# Patient Record
Sex: Female | Born: 1935
Health system: Southern US, Community
[De-identification: ages and names within clinical notes are randomized; demographics above are authoritative.]

## PROBLEM LIST (undated history)

## (undated) DIAGNOSIS — N184 Chronic kidney disease, stage 4 (severe): Secondary | ICD-10-CM

## (undated) DIAGNOSIS — E039 Hypothyroidism, unspecified: Secondary | ICD-10-CM

## (undated) DIAGNOSIS — E119 Type 2 diabetes mellitus without complications: Secondary | ICD-10-CM

## (undated) DIAGNOSIS — R609 Edema, unspecified: Secondary | ICD-10-CM

## (undated) DIAGNOSIS — J9610 Chronic respiratory failure, unspecified whether with hypoxia or hypercapnia: Secondary | ICD-10-CM

## (undated) DIAGNOSIS — J449 Chronic obstructive pulmonary disease, unspecified: Secondary | ICD-10-CM

## (undated) DIAGNOSIS — E876 Hypokalemia: Secondary | ICD-10-CM

## (undated) DIAGNOSIS — Z720 Tobacco use: Secondary | ICD-10-CM

## (undated) DIAGNOSIS — I10 Essential (primary) hypertension: Secondary | ICD-10-CM

## (undated) DIAGNOSIS — R600 Localized edema: Secondary | ICD-10-CM

## (undated) DIAGNOSIS — I480 Paroxysmal atrial fibrillation: Secondary | ICD-10-CM

## (undated) DIAGNOSIS — I5032 Chronic diastolic (congestive) heart failure: Secondary | ICD-10-CM

## (undated) HISTORY — DX: Chronic respiratory failure, unspecified whether with hypoxia or hypercapnia: J96.10

## (undated) HISTORY — DX: Tobacco use: Z72.0

## (undated) HISTORY — DX: Paroxysmal atrial fibrillation: I48.0

## (undated) HISTORY — DX: Localized edema: R60.0

## (undated) HISTORY — DX: Hypokalemia: E87.6

---

## 1998-03-22 ENCOUNTER — Other Ambulatory Visit: Admission: RE | Admit: 1998-03-22 | Discharge: 1998-03-22 | Payer: Self-pay | Admitting: Obstetrics & Gynecology

## 1999-05-15 ENCOUNTER — Other Ambulatory Visit: Admission: RE | Admit: 1999-05-15 | Discharge: 1999-05-15 | Payer: Self-pay | Admitting: Obstetrics & Gynecology

## 2000-06-09 ENCOUNTER — Other Ambulatory Visit: Admission: RE | Admit: 2000-06-09 | Discharge: 2000-06-09 | Payer: Self-pay | Admitting: Obstetrics & Gynecology

## 2006-03-16 ENCOUNTER — Inpatient Hospital Stay (HOSPITAL_COMMUNITY): Admission: EM | Admit: 2006-03-16 | Discharge: 2006-03-20 | Payer: Self-pay | Admitting: Emergency Medicine

## 2010-01-02 ENCOUNTER — Encounter
Admission: RE | Admit: 2010-01-02 | Discharge: 2010-01-24 | Payer: Self-pay | Source: Home / Self Care | Attending: Ophthalmology | Admitting: Ophthalmology

## 2010-06-22 NOTE — H&P (Signed)
NAME:  Kristy Mcdonald, Kristy Mcdonald             ACCOUNT NO.:  [id number]   MEDICAL RECORD NO.:  [id number]          PATIENT TYPE:  INP   LOCATION:  0102                         FACILITY:  [facility]   PHYSICIAN:  Della Goo, M.D. DATE OF BIRTH:  1932-08-11   DATE OF ADMISSION:  03/15/2006  DATE OF DISCHARGE:                              HISTORY & PHYSICAL   PRIMARY CARE PHYSICIAN:  Dr. Mia Creek   CHIEF COMPLAINT:  Shortness of breath.   HISTORY OF PRESENT ILLNESS:  This is a 77 year old female who was  brought to the emergency department by her family secondary to  complaints of worsening shortness of breath and wheezing.  The patient  has had chest congestion and a dry, hacky cough, fevers and chills and  body aches for approximately 1 week.  The patient is a smoker, smokes  approximately 2 packs per day for many years, reports having asthma.   PAST MEDICAL HISTORY:  1. Questionable asthma.  2. Type 2 diabetes mellitus.  3. Hypertension.  4. Hypothyroidism.   PAST SURGICAL HISTORY:  Status post ovarian cyst removal.   MEDICATIONS:  1. Amaryl 4 mg p.o. b.i.d.  2. Potassium chloride 20 mEq p.o. b.i.d.  3. Lasix 20 mg p.o. daily.  4. Armour Thyroid 60 mg 1 p.o. b.i.d.  5. Vicodin 5/500 p.r.n.  6. Ambien 10 mg 1 p.o. q.h.s. p.r.n.   ALLERGIES:  NO KNOWN DRUG ALLERGIES.   SOCIAL HISTORY:  The patient lives at home with her husband and family,  positive tobacco history, 2 packs per day.  No history of alcohol usage.  No history of drug usage.   FAMILY HISTORY:  Noncontributory   REVIEW OF SYSTEMS:  Pertinents are mentioned above.   PHYSICAL EXAMINATION FINDINGS:  GENERAL:  A 77 year old obese female in  no acute distress.   VITAL SIGNS:  Temperature 98.5, blood pressure 144/70, heart rate 78,  respirations 19-22, O2 saturations 94-96%.  HEENT:  Normocephalic, atraumatic.  Pupils equally round, reactive to  light.  Extraocular muscles are intact, funduscopic benign,  oropharynx  clear.  NECK:  Supple, full range of motion.  No thyromegaly, adenopathy, or  jugular venous distension.  CARDIOVASCULAR:  Regular rate and rhythm with occasional PVCs on the  monitor; no murmurs, gallops, or rubs.  LUNGS:  Decreased breath sounds bilaterally.  Positive rhonchi, no  rales, occasional expiratory wheeze.  ABDOMEN:  Positive bowel sounds, soft, nontender, nondistended.  EXTREMITIES:  Without edema.  NEUROLOGIC: Alert and oriented x3 without focal deficits.   LABORATORY STUDIES:  White blood cell count 6.0, hemoglobin 13.3,  hematocrit 38.5, platelets 152, D-dimer 0.49.  Sodium 140, potassium  3.5, chloride 104, CO2 29, BUN 11, creatinine 0.7, glucose 107, calcium  8.9, albumin 3.4, AST 31, ALT 28.  beta natriuretic peptide less than  30.0.  Arterial blood gas initially pH 7.420, pCO2 43.4, pO2 43.0,  bicarb 27.7, O2 saturation 77.9.  This was prior to supplemental oxygen.  The patient was placed on 15 liters of oxygen and had improvement in her  saturation.  A repeat blood gas will be performed.  Chest x-ray findings  are negative for pneumonia.  There are chronic bronchitic changes.  COPD  changes as well.  CT of the chest per the PE protocol was negative for  pulmonary embolism.  EKG revealed normal sinus rhythm without acute ST-  segment changes.   ASSESSMENT:  A 77 year old female with shortness of breath, chest  congestion, cough, wheezing. being admitted with:  1. Chronic obstructive pulmonary disease exacerbation.  2. Chronic with acute bronchitis.  3. Type 2 diabetes mellitus.  4. Hypertension.   PLAN:  The patient has been administered IV antibiotic therapy with  Avelox which will continue.  She has also been placed on nebulizer  treatments with Xopenex and Atrovent q.6 h and q.2 h p.r.n. Mucinex has  been ordered along with a high dose IV steroid taper.  Cardiac enzymes  will also be performed q.8 h, and the patient will be placed on  telemetry  for monitoring.  Nitro paste and aspirin have also been  ordered.  The patient will be placed on gastrointestinal and deep venous  thrombosis prophylaxis and continue on her regular medications.  A TSH  level and hemoglobin A1c will also be ordered and sliding scale coverage  for elevated blood sugars as needed.      Della Goo, M.D.  Electronically Signed     HJ/MEDQ  D:  03/16/2006  T:  03/16/2006  Job:  [id number]

## 2010-06-22 NOTE — Discharge Summary (Signed)
NAME:  Kristy Mcdonald             ACCOUNT NO.:  [id number]   MEDICAL RECORD NO.:  [id number]          PATIENT TYPE:  INP   LOCATION:  1414                         FACILITY:  [facility]   PHYSICIAN:  Michaelyn Barter, M.D. DATE OF BIRTH:  1932-12-23   DATE OF ADMISSION:  03/15/2006  DATE OF DISCHARGE:  03/20/2006                               DISCHARGE SUMMARY   FINAL DIAGNOSES:  1. Acute chronic obstructive pulmonary disease exacerbation.  2. Acute bronchitis.  3. Right upper lobe nodule.   SECONDARY DIAGNOSES:  1. Diabetes mellitus.  2. Hypertension.  3. Hypothyroidism.   PRIMARY CARE PHYSICIAN:  Unassigned.   PROCEDURES:  1. Chest x-ray completed March 15, 2006.  2. CT scan of the chest with angiographic contrast completed March 16, 2006.   HISTORY OF PRESENT ILLNESS:  Ms. Kristy Mcdonald is a 77 year old female who  arrived with a chief complaint of progressive shortness of breath  accompanied by wheezing.  She indicated that she had experienced chest  congestion accompanied by a hacky type of cough as well as subjective  fevers and chills and generalized body aches for approximately 1 week  prior to this admission.   PAST MEDICAL HISTORY:  Please see that dictated by Dr. Della Goo.   HOSPITAL COURSE:  1. Acute COPD exacerbation.  The patient had a chest x-ray completed      on March 15, 2006.  It revealed findings compatible with      prominent bronchitic markings.  No infiltrate.  Linear scarring      versus segmental atelectasis was found within the left lower lung      zone.  The patient was subsequently started on nebulized breathing      treatments as well as supplemental oxygen.  Over the course of her      hospitalization, her breathing did improve significantly.  She      indicated that she does have a longstanding history of cigarettes      usage.  She has been counseled to discontinue this activity.  2. Acute bronchitis.  The patient was started  on empiric IV      antibiotics with moxifloxacin 400 mg daily.  Again,l her      respiratory distress improved over the course of her      hospitalization.  3. Right upper lobe nodule.  The patient had a CT scan of her chest      completed on March 16, 2006.  It revealed no gross evidence of      pulmonary embolism with mild limitations of the exam; mild      scattered infiltrates were noted within the upper lobes with      subsegmental atelectasis within the lingula and right middle lobe.      There was a nonspecific 5 mm diameter right upper lobe nodule.  The      radiologist recommended that a repeat CT scan be completed within 6-      12 months to confirm stability.  4. Weakness/deconditioning.  This may have been associated with the  patient's respiratory-related symptoms.  5. Hypoxia.  This more than likely is secondary to a combination of      the patient's COPD as well as bronchitis.  The patient was allowed      to ambulate on the date of discharge and with ambulation, her sats      decreased to 88%.  Condition at the time of discharge, the patient      indicates currently that she feels better.  Her breathing is better      and she has no current new complaints.   VITALS ON THE DAY OF DISCHARGE:  Temperature is 97.6, heart rate 64,  respirations 20, blood pressure 129/67.  CBGs are 108.   The decision has been made to discharge the patient from the hospital.   CONDITION ON DISCHARGE:  The patient's condition at the time of  discharge is improved.   DISCHARGE MEDICATIONS:  The patient will be discharged from the hospital  on the following medications:  1. Lasix 20 mg one tablet p.o. daily.  2. K-Dur 20 mEq p.o. b.i.d.  3. Nicotine patch 14 mg daily.  4. Moxifloxacin 500 mg one tablet his daily.  5. Xopenex 0.63 mg via nebulizer q.6h.  6. Atrovent 0.5 mg via nebulizer q.6h.   The patient will have a nebulized breathing machine arranged for her.  She will also  have a visiting physical therapist, occupational therapy  and R.N.  I have called Dr. Renaye Rakers and arranged for a followup  appointment on March 26, 2006, at 3:45 p.m.  The patient has also  been told that she needs to stop smoking cigarettes.      Michaelyn Barter, M.D.  Electronically Signed     OR/MEDQ  D:  03/20/2006  T:  03/20/2006  Job:  [id number]   cc:   Renaye Rakers, M.D.  Fax: [phone]

## 2011-02-07 DIAGNOSIS — M999 Biomechanical lesion, unspecified: Secondary | ICD-10-CM | POA: Diagnosis not present

## 2011-02-07 DIAGNOSIS — M9981 Other biomechanical lesions of cervical region: Secondary | ICD-10-CM | POA: Diagnosis not present

## 2011-02-07 DIAGNOSIS — M5137 Other intervertebral disc degeneration, lumbosacral region: Secondary | ICD-10-CM | POA: Diagnosis not present

## 2011-03-28 DIAGNOSIS — M999 Biomechanical lesion, unspecified: Secondary | ICD-10-CM | POA: Diagnosis not present

## 2011-03-28 DIAGNOSIS — M5137 Other intervertebral disc degeneration, lumbosacral region: Secondary | ICD-10-CM | POA: Diagnosis not present

## 2011-03-28 DIAGNOSIS — M9981 Other biomechanical lesions of cervical region: Secondary | ICD-10-CM | POA: Diagnosis not present

## 2011-05-02 DIAGNOSIS — E119 Type 2 diabetes mellitus without complications: Secondary | ICD-10-CM | POA: Diagnosis not present

## 2011-06-11 DIAGNOSIS — H902 Conductive hearing loss, unspecified: Secondary | ICD-10-CM | POA: Diagnosis not present

## 2011-06-11 DIAGNOSIS — H6120 Impacted cerumen, unspecified ear: Secondary | ICD-10-CM | POA: Diagnosis not present

## 2011-08-07 DIAGNOSIS — J449 Chronic obstructive pulmonary disease, unspecified: Secondary | ICD-10-CM | POA: Diagnosis not present

## 2011-08-07 DIAGNOSIS — M1250 Traumatic arthropathy, unspecified site: Secondary | ICD-10-CM | POA: Diagnosis not present

## 2011-08-07 DIAGNOSIS — E119 Type 2 diabetes mellitus without complications: Secondary | ICD-10-CM | POA: Diagnosis not present

## 2011-08-07 DIAGNOSIS — I10 Essential (primary) hypertension: Secondary | ICD-10-CM | POA: Diagnosis not present

## 2011-09-19 DIAGNOSIS — M5137 Other intervertebral disc degeneration, lumbosacral region: Secondary | ICD-10-CM | POA: Diagnosis not present

## 2011-09-19 DIAGNOSIS — M999 Biomechanical lesion, unspecified: Secondary | ICD-10-CM | POA: Diagnosis not present

## 2011-09-19 DIAGNOSIS — M9981 Other biomechanical lesions of cervical region: Secondary | ICD-10-CM | POA: Diagnosis not present

## 2011-11-19 DIAGNOSIS — H35379 Puckering of macula, unspecified eye: Secondary | ICD-10-CM | POA: Diagnosis not present

## 2011-11-19 DIAGNOSIS — H35359 Cystoid macular degeneration, unspecified eye: Secondary | ICD-10-CM | POA: Diagnosis not present

## 2011-11-19 DIAGNOSIS — H3530 Unspecified macular degeneration: Secondary | ICD-10-CM | POA: Diagnosis not present

## 2011-12-05 DIAGNOSIS — I10 Essential (primary) hypertension: Secondary | ICD-10-CM | POA: Diagnosis not present

## 2011-12-05 DIAGNOSIS — E7800 Pure hypercholesterolemia, unspecified: Secondary | ICD-10-CM | POA: Diagnosis not present

## 2011-12-05 DIAGNOSIS — E119 Type 2 diabetes mellitus without complications: Secondary | ICD-10-CM | POA: Diagnosis not present

## 2011-12-05 DIAGNOSIS — G589 Mononeuropathy, unspecified: Secondary | ICD-10-CM | POA: Diagnosis not present

## 2012-01-10 DIAGNOSIS — H35379 Puckering of macula, unspecified eye: Secondary | ICD-10-CM | POA: Diagnosis not present

## 2012-01-10 DIAGNOSIS — Z01810 Encounter for preprocedural cardiovascular examination: Secondary | ICD-10-CM | POA: Diagnosis not present

## 2012-01-10 DIAGNOSIS — J449 Chronic obstructive pulmonary disease, unspecified: Secondary | ICD-10-CM | POA: Diagnosis present

## 2012-02-03 DIAGNOSIS — E119 Type 2 diabetes mellitus without complications: Secondary | ICD-10-CM | POA: Diagnosis not present

## 2012-02-03 DIAGNOSIS — H35379 Puckering of macula, unspecified eye: Secondary | ICD-10-CM | POA: Diagnosis not present

## 2012-02-03 DIAGNOSIS — I10 Essential (primary) hypertension: Secondary | ICD-10-CM | POA: Diagnosis not present

## 2012-02-03 DIAGNOSIS — E079 Disorder of thyroid, unspecified: Secondary | ICD-10-CM | POA: Diagnosis not present

## 2012-02-03 DIAGNOSIS — H35359 Cystoid macular degeneration, unspecified eye: Secondary | ICD-10-CM | POA: Diagnosis not present

## 2012-02-03 DIAGNOSIS — F17200 Nicotine dependence, unspecified, uncomplicated: Secondary | ICD-10-CM | POA: Diagnosis not present

## 2012-02-03 DIAGNOSIS — Z888 Allergy status to other drugs, medicaments and biological substances status: Secondary | ICD-10-CM | POA: Diagnosis not present

## 2012-02-03 DIAGNOSIS — M7989 Other specified soft tissue disorders: Secondary | ICD-10-CM | POA: Diagnosis not present

## 2012-02-03 DIAGNOSIS — J4489 Other specified chronic obstructive pulmonary disease: Secondary | ICD-10-CM | POA: Diagnosis not present

## 2012-02-03 DIAGNOSIS — Z8249 Family history of ischemic heart disease and other diseases of the circulatory system: Secondary | ICD-10-CM | POA: Diagnosis not present

## 2012-02-03 DIAGNOSIS — H353290 Exudative age-related macular degeneration, unspecified eye, stage unspecified: Secondary | ICD-10-CM | POA: Diagnosis not present

## 2012-02-03 DIAGNOSIS — J449 Chronic obstructive pulmonary disease, unspecified: Secondary | ICD-10-CM | POA: Diagnosis not present

## 2012-02-03 DIAGNOSIS — Z961 Presence of intraocular lens: Secondary | ICD-10-CM | POA: Diagnosis not present

## 2012-02-03 DIAGNOSIS — Z9849 Cataract extraction status, unspecified eye: Secondary | ICD-10-CM | POA: Diagnosis not present

## 2012-04-02 DIAGNOSIS — E119 Type 2 diabetes mellitus without complications: Secondary | ICD-10-CM | POA: Diagnosis not present

## 2012-04-02 DIAGNOSIS — E7800 Pure hypercholesterolemia, unspecified: Secondary | ICD-10-CM | POA: Diagnosis not present

## 2012-04-02 DIAGNOSIS — I10 Essential (primary) hypertension: Secondary | ICD-10-CM | POA: Diagnosis not present

## 2012-05-14 DIAGNOSIS — E119 Type 2 diabetes mellitus without complications: Secondary | ICD-10-CM | POA: Diagnosis not present

## 2012-05-14 DIAGNOSIS — M109 Gout, unspecified: Secondary | ICD-10-CM | POA: Diagnosis not present

## 2012-05-15 ENCOUNTER — Other Ambulatory Visit: Payer: Self-pay | Admitting: Family Medicine

## 2012-05-15 ENCOUNTER — Ambulatory Visit
Admission: RE | Admit: 2012-05-15 | Discharge: 2012-05-15 | Disposition: A | Discharge status: Home / Self Care | Payer: Medicare Other | Source: Ambulatory Visit | Attending: Family Medicine | Admitting: Family Medicine

## 2012-05-15 DIAGNOSIS — M79671 Pain in right foot: Secondary | ICD-10-CM

## 2012-05-15 DIAGNOSIS — M79609 Pain in unspecified limb: Secondary | ICD-10-CM | POA: Diagnosis not present

## 2012-06-30 DIAGNOSIS — E119 Type 2 diabetes mellitus without complications: Secondary | ICD-10-CM | POA: Diagnosis not present

## 2012-06-30 DIAGNOSIS — H35379 Puckering of macula, unspecified eye: Secondary | ICD-10-CM | POA: Diagnosis not present

## 2012-06-30 DIAGNOSIS — H3530 Unspecified macular degeneration: Secondary | ICD-10-CM | POA: Diagnosis not present

## 2012-06-30 DIAGNOSIS — H35359 Cystoid macular degeneration, unspecified eye: Secondary | ICD-10-CM | POA: Diagnosis not present

## 2012-06-30 DIAGNOSIS — J449 Chronic obstructive pulmonary disease, unspecified: Secondary | ICD-10-CM | POA: Diagnosis not present

## 2012-06-30 DIAGNOSIS — R609 Edema, unspecified: Secondary | ICD-10-CM | POA: Diagnosis not present

## 2012-07-30 DIAGNOSIS — I10 Essential (primary) hypertension: Secondary | ICD-10-CM | POA: Diagnosis not present

## 2012-07-30 DIAGNOSIS — E119 Type 2 diabetes mellitus without complications: Secondary | ICD-10-CM | POA: Diagnosis not present

## 2012-07-30 DIAGNOSIS — E7800 Pure hypercholesterolemia, unspecified: Secondary | ICD-10-CM | POA: Diagnosis not present

## 2012-08-13 DIAGNOSIS — E119 Type 2 diabetes mellitus without complications: Secondary | ICD-10-CM | POA: Diagnosis not present

## 2012-12-03 DIAGNOSIS — E7800 Pure hypercholesterolemia, unspecified: Secondary | ICD-10-CM | POA: Diagnosis not present

## 2012-12-03 DIAGNOSIS — I10 Essential (primary) hypertension: Secondary | ICD-10-CM | POA: Diagnosis not present

## 2012-12-03 DIAGNOSIS — E119 Type 2 diabetes mellitus without complications: Secondary | ICD-10-CM | POA: Diagnosis not present

## 2013-01-26 DIAGNOSIS — J449 Chronic obstructive pulmonary disease, unspecified: Secondary | ICD-10-CM | POA: Diagnosis not present

## 2013-01-26 DIAGNOSIS — E119 Type 2 diabetes mellitus without complications: Secondary | ICD-10-CM | POA: Diagnosis not present

## 2013-01-26 DIAGNOSIS — H35379 Puckering of macula, unspecified eye: Secondary | ICD-10-CM | POA: Diagnosis not present

## 2013-01-26 DIAGNOSIS — R609 Edema, unspecified: Secondary | ICD-10-CM | POA: Diagnosis not present

## 2013-01-26 DIAGNOSIS — H35359 Cystoid macular degeneration, unspecified eye: Secondary | ICD-10-CM | POA: Diagnosis not present

## 2013-01-26 DIAGNOSIS — H353290 Exudative age-related macular degeneration, unspecified eye, stage unspecified: Secondary | ICD-10-CM | POA: Diagnosis not present

## 2013-04-20 DIAGNOSIS — H353290 Exudative age-related macular degeneration, unspecified eye, stage unspecified: Secondary | ICD-10-CM | POA: Diagnosis not present

## 2013-04-20 DIAGNOSIS — H35359 Cystoid macular degeneration, unspecified eye: Secondary | ICD-10-CM | POA: Diagnosis not present

## 2013-04-27 DIAGNOSIS — H353290 Exudative age-related macular degeneration, unspecified eye, stage unspecified: Secondary | ICD-10-CM | POA: Diagnosis not present

## 2013-05-20 ENCOUNTER — Other Ambulatory Visit (HOSPITAL_COMMUNITY)
Admission: RE | Admit: 2013-05-20 | Discharge: 2013-05-20 | Disposition: A | Discharge status: Home / Self Care | Payer: Medicare Other | Source: Ambulatory Visit | Attending: Family Medicine | Admitting: Family Medicine

## 2013-05-20 DIAGNOSIS — N390 Urinary tract infection, site not specified: Secondary | ICD-10-CM | POA: Diagnosis not present

## 2013-05-20 DIAGNOSIS — Z113 Encounter for screening for infections with a predominantly sexual mode of transmission: Secondary | ICD-10-CM | POA: Diagnosis not present

## 2013-05-20 DIAGNOSIS — N760 Acute vaginitis: Secondary | ICD-10-CM | POA: Insufficient documentation

## 2013-05-20 DIAGNOSIS — E119 Type 2 diabetes mellitus without complications: Secondary | ICD-10-CM | POA: Diagnosis not present

## 2013-06-03 DIAGNOSIS — E119 Type 2 diabetes mellitus without complications: Secondary | ICD-10-CM | POA: Diagnosis not present

## 2013-06-29 DIAGNOSIS — E1139 Type 2 diabetes mellitus with other diabetic ophthalmic complication: Secondary | ICD-10-CM | POA: Diagnosis not present

## 2013-06-29 DIAGNOSIS — H35359 Cystoid macular degeneration, unspecified eye: Secondary | ICD-10-CM | POA: Diagnosis not present

## 2013-06-29 DIAGNOSIS — J449 Chronic obstructive pulmonary disease, unspecified: Secondary | ICD-10-CM | POA: Diagnosis not present

## 2013-06-29 DIAGNOSIS — H3530 Unspecified macular degeneration: Secondary | ICD-10-CM | POA: Diagnosis not present

## 2013-06-29 DIAGNOSIS — E11329 Type 2 diabetes mellitus with mild nonproliferative diabetic retinopathy without macular edema: Secondary | ICD-10-CM | POA: Diagnosis not present

## 2013-06-29 DIAGNOSIS — H35379 Puckering of macula, unspecified eye: Secondary | ICD-10-CM | POA: Diagnosis not present

## 2013-08-05 DIAGNOSIS — E119 Type 2 diabetes mellitus without complications: Secondary | ICD-10-CM | POA: Diagnosis not present

## 2013-10-27 DIAGNOSIS — E119 Type 2 diabetes mellitus without complications: Secondary | ICD-10-CM | POA: Diagnosis not present

## 2013-10-27 DIAGNOSIS — J069 Acute upper respiratory infection, unspecified: Secondary | ICD-10-CM | POA: Diagnosis not present

## 2013-10-27 DIAGNOSIS — N760 Acute vaginitis: Secondary | ICD-10-CM | POA: Diagnosis not present

## 2013-10-27 DIAGNOSIS — I10 Essential (primary) hypertension: Secondary | ICD-10-CM | POA: Diagnosis not present

## 2013-11-02 DIAGNOSIS — E11329 Type 2 diabetes mellitus with mild nonproliferative diabetic retinopathy without macular edema: Secondary | ICD-10-CM | POA: Diagnosis not present

## 2013-11-02 DIAGNOSIS — E1139 Type 2 diabetes mellitus with other diabetic ophthalmic complication: Secondary | ICD-10-CM | POA: Diagnosis not present

## 2013-11-02 DIAGNOSIS — H35359 Cystoid macular degeneration, unspecified eye: Secondary | ICD-10-CM | POA: Diagnosis not present

## 2013-11-02 DIAGNOSIS — H35379 Puckering of macula, unspecified eye: Secondary | ICD-10-CM | POA: Diagnosis not present

## 2013-11-02 DIAGNOSIS — H3530 Unspecified macular degeneration: Secondary | ICD-10-CM | POA: Diagnosis not present

## 2013-12-15 ENCOUNTER — Encounter (HOSPITAL_COMMUNITY): Payer: Self-pay | Admitting: *Deleted

## 2013-12-15 ENCOUNTER — Emergency Department (HOSPITAL_COMMUNITY): Payer: Medicare Other

## 2013-12-15 ENCOUNTER — Emergency Department (HOSPITAL_COMMUNITY)
Admission: EM | Admit: 2013-12-15 | Discharge: 2013-12-16 | Disposition: A | Discharge status: Home / Self Care | Payer: Medicare Other | Attending: Emergency Medicine | Admitting: Emergency Medicine

## 2013-12-15 DIAGNOSIS — H538 Other visual disturbances: Secondary | ICD-10-CM | POA: Diagnosis not present

## 2013-12-15 DIAGNOSIS — R51 Headache: Secondary | ICD-10-CM | POA: Insufficient documentation

## 2013-12-15 DIAGNOSIS — Z961 Presence of intraocular lens: Secondary | ICD-10-CM | POA: Diagnosis not present

## 2013-12-15 DIAGNOSIS — Z79899 Other long term (current) drug therapy: Secondary | ICD-10-CM | POA: Diagnosis not present

## 2013-12-15 DIAGNOSIS — H539 Unspecified visual disturbance: Secondary | ICD-10-CM

## 2013-12-15 DIAGNOSIS — H47011 Ischemic optic neuropathy, right eye: Secondary | ICD-10-CM | POA: Diagnosis not present

## 2013-12-15 DIAGNOSIS — E079 Disorder of thyroid, unspecified: Secondary | ICD-10-CM | POA: Diagnosis not present

## 2013-12-15 DIAGNOSIS — I10 Essential (primary) hypertension: Secondary | ICD-10-CM | POA: Diagnosis not present

## 2013-12-15 DIAGNOSIS — H5704 Mydriasis: Secondary | ICD-10-CM | POA: Insufficient documentation

## 2013-12-15 DIAGNOSIS — H3531 Nonexudative age-related macular degeneration: Secondary | ICD-10-CM | POA: Diagnosis not present

## 2013-12-15 DIAGNOSIS — H5711 Ocular pain, right eye: Secondary | ICD-10-CM | POA: Diagnosis present

## 2013-12-15 DIAGNOSIS — E119 Type 2 diabetes mellitus without complications: Secondary | ICD-10-CM | POA: Diagnosis not present

## 2013-12-15 DIAGNOSIS — M315 Giant cell arteritis with polymyalgia rheumatica: Secondary | ICD-10-CM | POA: Diagnosis not present

## 2013-12-15 HISTORY — DX: Essential (primary) hypertension: I10

## 2013-12-15 LAB — CBC WITH DIFFERENTIAL/PLATELET
BASOS ABS: 0 10*3/uL (ref 0.0–0.1)
BASOS PCT: 0 % (ref 0–1)
EOS ABS: 0.1 10*3/uL (ref 0.0–0.7)
EOS PCT: 1 % (ref 0–5)
HCT: 39.9 % (ref 36.0–46.0)
HEMOGLOBIN: 13.7 g/dL (ref 12.0–15.0)
LYMPHS PCT: 34 % (ref 12–46)
Lymphs Abs: 3.4 10*3/uL (ref 0.7–4.0)
MCH: 30.8 pg (ref 26.0–34.0)
MCHC: 34.3 g/dL (ref 30.0–36.0)
MCV: 89.7 fL (ref 78.0–100.0)
MONO ABS: 0.6 10*3/uL (ref 0.1–1.0)
MONOS PCT: 6 % (ref 3–12)
NEUTROS ABS: 5.8 10*3/uL (ref 1.7–7.7)
Neutrophils Relative %: 59 % (ref 43–77)
PLATELETS: 238 10*3/uL (ref 150–400)
RBC: 4.45 MIL/uL (ref 3.87–5.11)
RDW: 13.4 % (ref 11.5–15.5)
WBC: 9.9 10*3/uL (ref 4.0–10.5)

## 2013-12-15 LAB — SEDIMENTATION RATE: SED RATE: 8 mm/h (ref 0–22)

## 2013-12-15 MED ORDER — DIAZEPAM 5 MG PO TABS
10.0000 mg | ORAL_TABLET | Freq: Once | ORAL | Status: AC
Start: 2013-12-15 — End: 2013-12-15
  Administered 2013-12-15: 10 mg via ORAL
  Filled 2013-12-15: qty 2

## 2013-12-15 MED ORDER — LORAZEPAM 2 MG/ML IJ SOLN
1.0000 mg | Freq: Once | INTRAMUSCULAR | Status: DC
Start: 2013-12-15 — End: 2013-12-16
  Filled 2013-12-15: qty 1

## 2013-12-15 NOTE — ED Notes (Signed)
MD at bedside. 

## 2013-12-15 NOTE — ED Provider Notes (Signed)
81 year old female was sent to the ED by her ophthalmologist. She has been having pain behind her right high in the eye was dilated and he noted signs of possible decreased blood flow to the optic nerve and was concerned about possible vascular disease. Patient states that she has bad vision in that eye and it is not changed recently and that headaches have been going on for several weeks now. On exam, the right eye is still dilated from ophthalmologists exam and there is pallor of the optic disc about with sharp margins. There are no hemorrhages or exudates. Lungs are clear and heart has regular rate and rhythm. MRI was ordered to evaluate the optic nerve and brain but patient states that she cannot tolerate MRI scan and she is refusing IV and IV sedation. Neurology consult will be obtained but anticipate sending her back to her ophthalmologist for follow-up in consideration for obtaining MRI scans through an open scanner.  I saw and evaluated the patient, reviewed the resident's note and I agree with the findings and plan.    Delora Fuel, MD 29/19/16 [id number]

## 2013-12-15 NOTE — ED Provider Notes (Signed)
CSN: [id number]     Arrival date & time 12/15/13  1856 History   First MD Initiated Contact with Patient 12/15/13 1927     Chief Complaint  Patient presents with  . Headache  . Eye Pain     (Consider location/radiation/quality/duration/timing/severity/associated sxs/prior Treatment) HPI Comments: 80 y/o female with h/o macular degeneration, multiple prior eye surgeries presenting for evaluation of HA and worsening blurry vision. Symptoms present x 10 days. Notes right temporal HA. Baseline vision is blurry, unable to read without magnifying glass. Seen by her Ophthalmologist who was concerned for ischemic optic neuritis. She received dilated exam and IOP were 15 bilaterally. She denies weakness, numbness, gait abnormality, dizziness. No h/o head injury or anticoagulation.  The history is provided by the patient. No language interpreter was used.    Past Medical History  Diagnosis Date  . Thyroid disease   . Hypertension   . Diabetes mellitus without complication    History reviewed. No pertinent past surgical history. History reviewed. No pertinent family history. History  Substance Use Topics  . Smoking status: Not on file  . Smokeless tobacco: Not on file  . Alcohol Use: No   OB History    No data available     Review of Systems  Constitutional: Negative for fever.  HENT: Negative for congestion, hearing loss, sinus pressure and tinnitus.   Eyes: Positive for pain and visual disturbance. Negative for discharge and redness.  Respiratory: Negative for chest tightness and shortness of breath.   Cardiovascular: Negative for chest pain.  Gastrointestinal: Negative for nausea and vomiting.  Musculoskeletal: Negative for neck pain and neck stiffness.  Neurological: Positive for headaches. Negative for dizziness, facial asymmetry, speech difficulty, weakness and numbness.  Hematological: Does not bruise/bleed easily.  All other systems reviewed and are  negative.     Allergies  Nicotine  Home Medications   Prior to Admission medications   Medication Sig Start Date End Date Taking? Authorizing Provider  albuterol (PROVENTIL HFA;VENTOLIN HFA) 108 (90 BASE) MCG/ACT inhaler Inhale 2 puffs into the lungs every 6 (six) hours as needed for wheezing or shortness of breath.   Yes Historical Provider, MD  amitriptyline (ELAVIL) 10 MG tablet Take 10 mg by mouth at bedtime.   Yes Historical Provider, MD  diazepam (VALIUM) 5 MG tablet Take 2.5 mg by mouth at bedtime. Take every night per patient   Yes Historical Provider, MD  furosemide (LASIX) 20 MG tablet Take 20 mg by mouth daily.   Yes Historical Provider, MD  glimepiride (AMARYL) 4 MG tablet Take 4 mg by mouth daily with breakfast.   Yes Historical Provider, MD  levothyroxine (SYNTHROID, LEVOTHROID) 50 MCG tablet Take 50 mcg by mouth daily before breakfast.   Yes Historical Provider, MD  metFORMIN (GLUMETZA) 1000 MG (MOD) 24 hr tablet Take 1,000 mg by mouth daily with breakfast.   Yes Historical Provider, MD  metolazone (ZAROXOLYN) 2.5 MG tablet Take 2.5 mg by mouth daily.   Yes Historical Provider, MD  Polyethyl Glycol-Propyl Glycol (SYSTANE FREE OP) Place 1 drop into both eyes daily as needed. For dry eyes   Yes Historical Provider, MD  potassium chloride SA (K-DUR,KLOR-CON) 20 MEQ tablet Take 20 mEq by mouth daily.   Yes Historical Provider, MD  tiotropium (SPIRIVA HANDIHALER) 18 MCG inhalation capsule Place 18 mcg into inhaler and inhale daily.   Yes Historical Provider, MD   BP 123/62 mmHg  Pulse 70  Temp(Src) 97.7 F (36.5 C)  Resp 21  Ht  5' 5"  (1.651 m)  Wt 207 lb (93.895 kg)  BMI 34.45 kg/m2  SpO2 95% Physical Exam  Constitutional: She is oriented to person, place, and time. She appears well-developed and well-nourished. No distress.  HENT:  Head: Normocephalic and atraumatic.  Mouth/Throat: Oropharynx is clear and moist.  Eyes: Conjunctivae, EOM and lids are normal.   Fundoscopic exam:      The right eye shows no exudate, no hemorrhage and no papilledema.       The left eye shows no exudate, no hemorrhage and no papilledema.  Pupils dilated  Neck: Normal range of motion. Neck supple.  Cardiovascular: Normal rate and regular rhythm.   Pulmonary/Chest: Effort normal and breath sounds normal.  Abdominal: Soft. There is no tenderness.  Musculoskeletal: Normal range of motion.  Neurological: She is alert and oriented to person, place, and time. She has normal strength and normal reflexes. No cranial nerve deficit or sensory deficit. She displays a negative Romberg sign. Coordination and gait normal. GCS eye subscore is 4. GCS verbal subscore is 5. GCS motor subscore is 6.  Skin: Skin is warm.  Vitals reviewed.   ED Course  Procedures (including critical care time) Labs Review Labs Reviewed  SEDIMENTATION RATE  CBC WITH DIFFERENTIAL    Imaging Review Mr Brain Wo Contrast  12/15/2013   CLINICAL DATA:  Initial valuation for headache, vision problems.  EXAM: MRI HEAD WITHOUT CONTRAST  TECHNIQUE: Multiplanar, multiecho pulse sequences of the brain and surrounding structures were obtained without intravenous contrast.  COMPARISON:  None available.  FINDINGS: Study is limited as the patient was only able to tolerate a portion of the exam. Sagittal T1 weighted sequence with diffusion-weighted sequences are provided for interpretation.  No abnormal foci of restricted diffusion to suggest acute intracranial hand marked identified. Gray-white matter differentiation grossly maintained.  No mass lesion or midline shift. Ventricles are normal in size without evidence of hydrocephalus. No definite extra-axial fluid collection. No definite hemorrhage.  Craniocervical junction within normal limits. Pituitary gland with unremarkable. Visualized bone marrow signal intensity is unremarkable  Paranasal sinuses are grossly clear.  IMPRESSION: Limited study demonstrating no acute  intracranial infarct or other abnormality.   Electronically Signed   By: Jeannine Boga M.D.   On: 12/15/2013 23:19     EKG Interpretation None      MDM   Final diagnoses:  Vision abnormalities    80 y/o female with h/o macular degeneration with HA and blurry vision x 10 days. Now reports blurry vision at baseline. Ophthalmologist concerned for ischemic optic neuritis. IOP wnl. Eyes dilated from exam. No hemorrhage or exudates. Discs sharp. Nonfocal neuro exam, low suspicion for central etiology. Visual acuity not tested due to lack of corrective lenses and dilated pupils. ESR not elevated, doubt temporal arteritis. Pt sent for MRI brain/orbits after given valium but unable to tolerate due to anxiety. She refuses IV and sedation. Will consult Neurology  Who is in agreement with plan for outpatient open MRI. Will f/u with her ophthalmologist this week.     Amparo Bristol, MD 12/17/13 [phone]

## 2013-12-15 NOTE — ED Notes (Signed)
Pt desating while at rest down to 85% RA; O2 placed at 2 L/M

## 2013-12-15 NOTE — ED Notes (Signed)
Pt reports having right eye pain, blurred vision and headache x 10 days. Went to eye dr today and told to come here due to possible ischemic optic neuropathy.

## 2013-12-15 NOTE — Consult Note (Signed)
NEURO HOSPITALIST CONSULT NOTE    Reason for Consult: right eye pain and visual disturbances.  HPI:                                                                                                                                          Kristy Mcdonald is an 80 y.o. female with a past medical history significant for HTN, DM type II, thyroid disease, macular degeneration and COPD, sent to the ED by her ophthalmologist for possible AION. Patient indicated that she has been having pain in the right eye for the past 10 days that got worse today. She said that she has poor vision in her right eye due to macular degeneration and overall her vision stays the same. She describes a daily, intermittent, severe, disabling pain around and behind the right eye that wakes her up around 6 am daily, last for approximately 1 hour, subsides after taking tylenol, and then comes back again. Touching the right upper corner of the right eye causes some discomfort but no tenderness right temporal area. Doesn't pace the floor. Stated that the right eye gets " puffy and watery" but denies nasal congestion, sensitivity to light or noise, vertigo, double vision, focal weakness/numbness, slurred speech, or language impairment. No jaw claudication, difficulty chewing or swallowing, or fever.   She cannot tolerate MRI scan and she is refusing IV and IV sedation. Sed rate normal.   Past Medical History  Diagnosis Date  . Thyroid disease   . Hypertension   . Diabetes mellitus without complication     History reviewed. No pertinent past surgical history.  History reviewed. No pertinent family history.   Social History:  reports that she does not drink alcohol or use illicit drugs. Her tobacco history is not on file.  Allergies  Allergen Reactions  . Nicotine     Unknown    MEDICATIONS:                                                                                                                      I have reviewed the patient's current medications.   ROS:  History obtained from the patient and chart review  General ROS: negative for - chills, fatigue, fever, night sweats, or weight loss Psychological ROS: negative for - behavioral disorder, hallucinations, memory difficulties, mood swings or suicidal ideation Ophthalmic ROS: negative for - double vision ENT ROS: negative for - epistaxis, nasal discharge, oral lesions, sore throat, tinnitus or vertigo Allergy and Immunology ROS: negative for - hives or itchy/watery eyes Hematological and Lymphatic ROS: negative for - bleeding problems, bruising or swollen lymph nodes Endocrine ROS: negative for - galactorrhea, hair pattern changes, polydipsia/polyuria or temperature intolerance Respiratory ROS: negative for - cough, hemoptysis, shortness of breath or wheezing Cardiovascular ROS: negative for - chest pain, dyspnea on exertion, edema or irregular heartbeat Gastrointestinal ROS: negative for - abdominal pain, diarrhea, hematemesis, nausea/vomiting or stool incontinence Genito-Urinary ROS: negative for - dysuria, hematuria, incontinence or urinary frequency/urgency Musculoskeletal ROS: negative for - joint swelling or muscular weakness Neurological ROS: as noted in HPI Dermatological ROS: negative for rash and skin lesion changes  Physical exam: pleasant female in no apparent distress. Blood pressure 123/62, pulse 70, temperature 97.7 F (36.5 C), resp. rate 21, height 5\' 5"  (1.651 m), weight 93.895 kg (207 lb), SpO2 95 %. Head: normocephalic. Neck: supple, no bruits, no JVD. Cardiac: no murmurs. Lungs: clear. Abdomen: soft, no tender, no mass. Extremities: no edema.  Neurologic Examination:                                                                                                        General: Mental Status: Alert, oriented, thought content appropriate.  Speech fluent without evidence of aphasia.  Able to follow 3 step commands without difficulty. Cranial Nerves: II: Discs flat bilaterally without hemorrhages or exudates.Visual fields grossly normal, pupils are still dilated after recent examination by ophthalmologist and are  equal, round, reactive to light and accommodation III,IV, VI: ptosis not present, extra-ocular motions intact bilaterally V,VII: smile symmetric, facial light touch sensation normal bilaterally VIII: hearing normal bilaterally IX,X: gag reflex present XI: bilateral shoulder shrug XII: midline tongue extension without atrophy or fasciculations  Motor: Right : Upper extremity   5/5    Left:     Upper extremity   5/5  Lower extremity   5/5     Lower extremity   5/5 Tone and bulk:normal tone throughout; no atrophy noted Sensory: Pinprick and light touch intact throughout, bilaterally Deep Tendon Reflexes:  Right: Upper Extremity   Left: Upper extremity   biceps (C-5 to C-6) 2/4   biceps (C-5 to C-6) 2/4 tricep (C7) 2/4    triceps (C7) 2/4 Brachioradialis (C6) 2/4  Brachioradialis (C6) 2/4  Lower Extremity Lower Extremity  quadriceps (L-2 to L-4) 2/4   quadriceps (L-2 to L-4) 2/4 Achilles (S1) 2/4   Achilles (S1) 2/4  Plantars: Right: downgoing   Left: downgoing Cerebellar: normal finger-to-nose,  normal heel-to-shin test Gait:  No tested.  No results found for: CHOL  Results for orders placed or performed during the hospital encounter of 12/15/13 (from the past 48 hour(s))  Sedimentation rate  Status: None   Collection Time: 12/15/13  8:01 PM  Result Value Ref Range   Sed Rate 8 0 - 22 mm/hr  CBC with Differential     Status: None   Collection Time: 12/15/13  8:01 PM  Result Value Ref Range   WBC 9.9 4.0 - 10.5 K/uL    Comment: WHITE COUNT CONFIRMED ON SMEAR   RBC 4.45 3.87 - 5.11 MIL/uL   Hemoglobin 13.7 12.0 - 15.0 g/dL    HCT 39.9 36.0 - 46.0 %   MCV 89.7 78.0 - 100.0 fL   MCH 30.8 26.0 - 34.0 pg   MCHC 34.3 30.0 - 36.0 g/dL   RDW 13.4 11.5 - 15.5 %   Platelets 238 150 - 400 K/uL    Comment: PLATELET COUNT CONFIRMED BY SMEAR   Neutrophils Relative % 59 43 - 77 %   Lymphocytes Relative 34 12 - 46 %   Monocytes Relative 6 3 - 12 %   Eosinophils Relative 1 0 - 5 %   Basophils Relative 0 0 - 1 %   Neutro Abs 5.8 1.7 - 7.7 K/uL   Lymphs Abs 3.4 0.7 - 4.0 K/uL   Monocytes Absolute 0.6 0.1 - 1.0 K/uL   Eosinophils Absolute 0.1 0.0 - 0.7 K/uL   Basophils Absolute 0.0 0.0 - 0.1 K/uL   WBC Morphology ATYPICAL LYMPHOCYTES     Mr Brain Wo Contrast  12/15/2013   CLINICAL DATA:  Initial valuation for headache, vision problems.  EXAM: MRI HEAD WITHOUT CONTRAST  TECHNIQUE: Multiplanar, multiecho pulse sequences of the brain and surrounding structures were obtained without intravenous contrast.  COMPARISON:  None available.  FINDINGS: Study is limited as the patient was only able to tolerate a portion of the exam. Sagittal T1 weighted sequence with diffusion-weighted sequences are provided for interpretation.  No abnormal foci of restricted diffusion to suggest acute intracranial hand marked identified. Gray-white matter differentiation grossly maintained.  No mass lesion or midline shift. Ventricles are normal in size without evidence of hydrocephalus. No definite extra-axial fluid collection. No definite hemorrhage.  Craniocervical junction within normal limits. Pituitary gland with unremarkable. Visualized bone marrow signal intensity is unremarkable  Paranasal sinuses are grossly clear.  IMPRESSION: Limited study demonstrating no acute intracranial infarct or other abnormality.   Electronically Signed   By: Jeannine Boga M.D.   On: 12/15/2013 23:19   Assessment/Plan: 80 y/o with DM, HTN, sent to the ED by her ophthalmologist due to HA and blurred vision for the past 10 days, and concern for AION.  Sed rate  negative and the overall picture is not reminiscent of temporal arteritis. Can not entirely exclude a short lasting HA.  Patient has been symptomatic for at least 10 days and she adamantly denies a change in her visual acuity saying that " I have macular degeneration and my vision stays the same". Patient unable to tolerate MRI and is refusing IV sedation to attempt MRI. No availability of open MRI in the hospital and thus this will need to be arrange as outpatient.   Dorian Pod, MD 12/15/2013, 11:56 PM

## 2013-12-15 NOTE — ED Notes (Signed)
Attempted to start IV and give med; pt states she will not go back to MRI; Md notified;Md assess pt. Again

## 2013-12-15 NOTE — ED Notes (Signed)
Patient transported to MRI 

## 2013-12-16 DIAGNOSIS — H538 Other visual disturbances: Secondary | ICD-10-CM | POA: Diagnosis not present

## 2013-12-16 DIAGNOSIS — H539 Unspecified visual disturbance: Secondary | ICD-10-CM

## 2013-12-16 MED ORDER — OXYCODONE-ACETAMINOPHEN 5-325 MG PO TABS
1.0000 | ORAL_TABLET | Freq: Once | ORAL | Status: AC
  Administered 2013-12-16: 1 via ORAL
  Filled 2013-12-16: qty 1

## 2013-12-16 NOTE — Discharge Instructions (Signed)
Visual Disturbances °You have had a disturbance in your vision. This may be caused by various conditions, such as: °· Migraines. Migraine headaches are often preceded by a disturbance in vision. Blind spots or light flashes are followed by a headache. This type of visual disturbance is temporary. It does not damage the eye. °· Glaucoma. This is caused by increased pressure in the eye. Symptoms include haziness, blurred vision, or seeing rainbow colored circles when looking at bright lights. Partial or complete visual loss can occur. You may or may not experience eye pain. Visual loss may be gradual or sudden and is irreversible. Glaucoma is the leading cause of blindness. °· Retina problems. Vision will be reduced if the retina becomes detached or if there is a circulation problem as with diabetes, high blood pressure, or a mini-stroke. Symptoms include seeing "floaters," flashes of light, or shadows, as if a curtain has fallen over your eye. °· Optic nerve problems. The main nerve in your eye can be damaged by redness, soreness, and swelling (inflammation), poor circulation, drugs, and toxins. °It is very important to have a complete exam done by a specialist to determine the exact cause of your eye problem. The specialist may recommend medicines or surgery, depending on the cause of the problem. This can help prevent further loss of vision or reduce the risk of having a stroke. Contact the caregiver to whom you have been referred and arrange for follow-up care right away. °SEEK IMMEDIATE MEDICAL CARE IF:  °· Your vision gets worse. °· You develop severe headaches. °· You have any weakness or numbness in the face, arms, or legs. °· You have any trouble speaking or walking. °Document Released: 02/29/2004 Document Revised: 04/15/2011 Document Reviewed: 06/21/2009 °[employer]® Patient Information ©2015 [employer]. This information is not intended to replace advice given to you by your health care provider. Make sure  you discuss any questions you have with your health care provider. ° °

## 2013-12-22 DIAGNOSIS — Z961 Presence of intraocular lens: Secondary | ICD-10-CM | POA: Diagnosis not present

## 2013-12-22 DIAGNOSIS — H47011 Ischemic optic neuropathy, right eye: Secondary | ICD-10-CM | POA: Diagnosis not present

## 2013-12-22 DIAGNOSIS — E119 Type 2 diabetes mellitus without complications: Secondary | ICD-10-CM | POA: Diagnosis not present

## 2013-12-22 DIAGNOSIS — H3531 Nonexudative age-related macular degeneration: Secondary | ICD-10-CM | POA: Diagnosis not present

## 2013-12-28 DIAGNOSIS — H468 Other optic neuritis: Secondary | ICD-10-CM | POA: Diagnosis not present

## 2014-01-03 DIAGNOSIS — H468 Other optic neuritis: Secondary | ICD-10-CM | POA: Diagnosis not present

## 2014-01-03 DIAGNOSIS — E119 Type 2 diabetes mellitus without complications: Secondary | ICD-10-CM | POA: Diagnosis not present

## 2014-01-03 DIAGNOSIS — H3531 Nonexudative age-related macular degeneration: Secondary | ICD-10-CM | POA: Diagnosis not present

## 2014-01-03 DIAGNOSIS — Z961 Presence of intraocular lens: Secondary | ICD-10-CM | POA: Diagnosis not present

## 2014-01-04 DIAGNOSIS — H3530 Unspecified macular degeneration: Secondary | ICD-10-CM | POA: Diagnosis not present

## 2014-01-04 DIAGNOSIS — E11329 Type 2 diabetes mellitus with mild nonproliferative diabetic retinopathy without macular edema: Secondary | ICD-10-CM | POA: Diagnosis not present

## 2014-01-04 DIAGNOSIS — H35373 Puckering of macula, bilateral: Secondary | ICD-10-CM | POA: Diagnosis not present

## 2014-03-01 DIAGNOSIS — E119 Type 2 diabetes mellitus without complications: Secondary | ICD-10-CM | POA: Diagnosis not present

## 2014-03-01 DIAGNOSIS — J0100 Acute maxillary sinusitis, unspecified: Secondary | ICD-10-CM | POA: Diagnosis not present

## 2014-03-01 DIAGNOSIS — R634 Abnormal weight loss: Secondary | ICD-10-CM | POA: Diagnosis not present

## 2014-03-17 DIAGNOSIS — E119 Type 2 diabetes mellitus without complications: Secondary | ICD-10-CM | POA: Diagnosis not present

## 2014-03-17 DIAGNOSIS — O871 Deep phlebothrombosis in the puerperium: Secondary | ICD-10-CM | POA: Diagnosis not present

## 2014-03-17 DIAGNOSIS — L03116 Cellulitis of left lower limb: Secondary | ICD-10-CM | POA: Diagnosis not present

## 2014-03-18 ENCOUNTER — Other Ambulatory Visit: Payer: Self-pay | Admitting: Family Medicine

## 2014-03-18 DIAGNOSIS — I82403 Acute embolism and thrombosis of unspecified deep veins of lower extremity, bilateral: Secondary | ICD-10-CM

## 2014-03-18 DIAGNOSIS — M7989 Other specified soft tissue disorders: Secondary | ICD-10-CM | POA: Diagnosis not present

## 2014-03-23 DIAGNOSIS — L03011 Cellulitis of right finger: Secondary | ICD-10-CM | POA: Diagnosis not present

## 2014-03-23 DIAGNOSIS — E119 Type 2 diabetes mellitus without complications: Secondary | ICD-10-CM | POA: Diagnosis not present

## 2014-04-04 DIAGNOSIS — E1065 Type 1 diabetes mellitus with hyperglycemia: Secondary | ICD-10-CM | POA: Diagnosis not present

## 2014-04-18 DIAGNOSIS — E119 Type 2 diabetes mellitus without complications: Secondary | ICD-10-CM | POA: Diagnosis not present

## 2014-04-18 DIAGNOSIS — L258 Unspecified contact dermatitis due to other agents: Secondary | ICD-10-CM | POA: Diagnosis not present

## 2014-04-18 DIAGNOSIS — I1310 Hypertensive heart and chronic kidney disease without heart failure, with stage 1 through stage 4 chronic kidney disease, or unspecified chronic kidney disease: Secondary | ICD-10-CM | POA: Diagnosis not present

## 2014-05-09 DIAGNOSIS — E119 Type 2 diabetes mellitus without complications: Secondary | ICD-10-CM | POA: Diagnosis not present

## 2014-05-09 DIAGNOSIS — H3531 Nonexudative age-related macular degeneration: Secondary | ICD-10-CM | POA: Diagnosis not present

## 2014-05-09 DIAGNOSIS — H468 Other optic neuritis: Secondary | ICD-10-CM | POA: Diagnosis not present

## 2014-05-09 DIAGNOSIS — Z961 Presence of intraocular lens: Secondary | ICD-10-CM | POA: Diagnosis not present

## 2014-06-21 DIAGNOSIS — H3530 Unspecified macular degeneration: Secondary | ICD-10-CM | POA: Diagnosis not present

## 2014-06-21 DIAGNOSIS — H35353 Cystoid macular degeneration, bilateral: Secondary | ICD-10-CM | POA: Diagnosis not present

## 2014-06-21 DIAGNOSIS — H35373 Puckering of macula, bilateral: Secondary | ICD-10-CM | POA: Diagnosis not present

## 2014-09-08 DIAGNOSIS — I1310 Hypertensive heart and chronic kidney disease without heart failure, with stage 1 through stage 4 chronic kidney disease, or unspecified chronic kidney disease: Secondary | ICD-10-CM | POA: Diagnosis not present

## 2014-09-08 DIAGNOSIS — E876 Hypokalemia: Secondary | ICD-10-CM | POA: Diagnosis not present

## 2014-09-08 DIAGNOSIS — E119 Type 2 diabetes mellitus without complications: Secondary | ICD-10-CM | POA: Diagnosis not present

## 2014-09-08 DIAGNOSIS — M109 Gout, unspecified: Secondary | ICD-10-CM | POA: Diagnosis not present

## 2014-09-08 DIAGNOSIS — E039 Hypothyroidism, unspecified: Secondary | ICD-10-CM | POA: Diagnosis not present

## 2014-09-08 DIAGNOSIS — E874 Mixed disorder of acid-base balance: Secondary | ICD-10-CM | POA: Diagnosis not present

## 2014-09-08 DIAGNOSIS — E034 Atrophy of thyroid (acquired): Secondary | ICD-10-CM | POA: Diagnosis not present

## 2014-09-08 DIAGNOSIS — L258 Unspecified contact dermatitis due to other agents: Secondary | ICD-10-CM | POA: Diagnosis not present

## 2014-11-08 DIAGNOSIS — E113293 Type 2 diabetes mellitus with mild nonproliferative diabetic retinopathy without macular edema, bilateral: Secondary | ICD-10-CM | POA: Diagnosis not present

## 2014-11-08 DIAGNOSIS — H35373 Puckering of macula, bilateral: Secondary | ICD-10-CM | POA: Diagnosis not present

## 2014-11-08 DIAGNOSIS — Z961 Presence of intraocular lens: Secondary | ICD-10-CM | POA: Diagnosis not present

## 2014-11-08 DIAGNOSIS — H3530 Unspecified macular degeneration: Secondary | ICD-10-CM | POA: Diagnosis not present

## 2014-11-29 DIAGNOSIS — H01025 Squamous blepharitis left lower eyelid: Secondary | ICD-10-CM | POA: Diagnosis not present

## 2014-11-29 DIAGNOSIS — H02834 Dermatochalasis of left upper eyelid: Secondary | ICD-10-CM | POA: Diagnosis not present

## 2014-11-29 DIAGNOSIS — H01021 Squamous blepharitis right upper eyelid: Secondary | ICD-10-CM | POA: Diagnosis not present

## 2014-11-29 DIAGNOSIS — H02831 Dermatochalasis of right upper eyelid: Secondary | ICD-10-CM | POA: Diagnosis not present

## 2014-11-29 DIAGNOSIS — D3132 Benign neoplasm of left choroid: Secondary | ICD-10-CM | POA: Diagnosis not present

## 2014-11-29 DIAGNOSIS — H01024 Squamous blepharitis left upper eyelid: Secondary | ICD-10-CM | POA: Diagnosis not present

## 2014-11-29 DIAGNOSIS — H468 Other optic neuritis: Secondary | ICD-10-CM | POA: Diagnosis not present

## 2014-11-29 DIAGNOSIS — H01022 Squamous blepharitis right lower eyelid: Secondary | ICD-10-CM | POA: Diagnosis not present

## 2014-12-15 DIAGNOSIS — Z23 Encounter for immunization: Secondary | ICD-10-CM | POA: Diagnosis not present

## 2014-12-15 DIAGNOSIS — R7309 Other abnormal glucose: Secondary | ICD-10-CM | POA: Diagnosis not present

## 2014-12-15 DIAGNOSIS — M109 Gout, unspecified: Secondary | ICD-10-CM | POA: Diagnosis not present

## 2014-12-15 DIAGNOSIS — I1310 Hypertensive heart and chronic kidney disease without heart failure, with stage 1 through stage 4 chronic kidney disease, or unspecified chronic kidney disease: Secondary | ICD-10-CM | POA: Diagnosis not present

## 2014-12-15 DIAGNOSIS — E039 Hypothyroidism, unspecified: Secondary | ICD-10-CM | POA: Diagnosis not present

## 2014-12-15 DIAGNOSIS — E034 Atrophy of thyroid (acquired): Secondary | ICD-10-CM | POA: Diagnosis not present

## 2015-02-23 DIAGNOSIS — I1310 Hypertensive heart and chronic kidney disease without heart failure, with stage 1 through stage 4 chronic kidney disease, or unspecified chronic kidney disease: Secondary | ICD-10-CM | POA: Diagnosis not present

## 2015-02-23 DIAGNOSIS — M109 Gout, unspecified: Secondary | ICD-10-CM | POA: Diagnosis not present

## 2015-02-23 DIAGNOSIS — E119 Type 2 diabetes mellitus without complications: Secondary | ICD-10-CM | POA: Diagnosis not present

## 2015-03-30 DIAGNOSIS — I10 Essential (primary) hypertension: Secondary | ICD-10-CM | POA: Diagnosis not present

## 2015-03-30 DIAGNOSIS — E039 Hypothyroidism, unspecified: Secondary | ICD-10-CM | POA: Diagnosis not present

## 2015-03-30 DIAGNOSIS — Z6832 Body mass index (BMI) 32.0-32.9, adult: Secondary | ICD-10-CM | POA: Diagnosis not present

## 2015-03-30 DIAGNOSIS — E089 Diabetes mellitus due to underlying condition without complications: Secondary | ICD-10-CM | POA: Diagnosis not present

## 2015-03-30 DIAGNOSIS — E119 Type 2 diabetes mellitus without complications: Secondary | ICD-10-CM | POA: Diagnosis not present

## 2015-03-30 DIAGNOSIS — M109 Gout, unspecified: Secondary | ICD-10-CM | POA: Diagnosis not present

## 2015-05-09 DIAGNOSIS — D3132 Benign neoplasm of left choroid: Secondary | ICD-10-CM | POA: Diagnosis not present

## 2015-05-09 DIAGNOSIS — Z6837 Body mass index (BMI) 37.0-37.9, adult: Secondary | ICD-10-CM | POA: Diagnosis not present

## 2015-05-09 DIAGNOSIS — H04123 Dry eye syndrome of bilateral lacrimal glands: Secondary | ICD-10-CM | POA: Diagnosis not present

## 2015-05-09 DIAGNOSIS — E039 Hypothyroidism, unspecified: Secondary | ICD-10-CM | POA: Diagnosis not present

## 2015-05-09 DIAGNOSIS — H01021 Squamous blepharitis right upper eyelid: Secondary | ICD-10-CM | POA: Diagnosis not present

## 2015-05-09 DIAGNOSIS — E119 Type 2 diabetes mellitus without complications: Secondary | ICD-10-CM | POA: Diagnosis not present

## 2015-05-09 DIAGNOSIS — Z961 Presence of intraocular lens: Secondary | ICD-10-CM | POA: Diagnosis not present

## 2015-05-09 DIAGNOSIS — E1065 Type 1 diabetes mellitus with hyperglycemia: Secondary | ICD-10-CM | POA: Diagnosis not present

## 2015-05-09 DIAGNOSIS — H10413 Chronic giant papillary conjunctivitis, bilateral: Secondary | ICD-10-CM | POA: Diagnosis not present

## 2015-05-09 DIAGNOSIS — H468 Other optic neuritis: Secondary | ICD-10-CM | POA: Diagnosis not present

## 2015-05-09 DIAGNOSIS — H353130 Nonexudative age-related macular degeneration, bilateral, stage unspecified: Secondary | ICD-10-CM | POA: Diagnosis not present

## 2015-05-09 DIAGNOSIS — L03011 Cellulitis of right finger: Secondary | ICD-10-CM | POA: Diagnosis not present

## 2015-05-17 DIAGNOSIS — I1310 Hypertensive heart and chronic kidney disease without heart failure, with stage 1 through stage 4 chronic kidney disease, or unspecified chronic kidney disease: Secondary | ICD-10-CM | POA: Diagnosis not present

## 2015-05-17 DIAGNOSIS — I10 Essential (primary) hypertension: Secondary | ICD-10-CM | POA: Diagnosis not present

## 2015-05-17 DIAGNOSIS — M109 Gout, unspecified: Secondary | ICD-10-CM | POA: Diagnosis not present

## 2015-05-17 DIAGNOSIS — E039 Hypothyroidism, unspecified: Secondary | ICD-10-CM | POA: Diagnosis not present

## 2015-06-01 DIAGNOSIS — S81802A Unspecified open wound, left lower leg, initial encounter: Secondary | ICD-10-CM | POA: Diagnosis not present

## 2015-06-01 DIAGNOSIS — Z6839 Body mass index (BMI) 39.0-39.9, adult: Secondary | ICD-10-CM | POA: Diagnosis not present

## 2015-06-06 DIAGNOSIS — M79662 Pain in left lower leg: Secondary | ICD-10-CM | POA: Diagnosis not present

## 2015-06-07 ENCOUNTER — Ambulatory Visit (HOSPITAL_COMMUNITY)
Admission: RE | Admit: 2015-06-07 | Discharge: 2015-06-07 | Disposition: A | Discharge status: Home / Self Care | Payer: Medicare Other | Source: Ambulatory Visit | Attending: Cardiovascular Disease | Admitting: Cardiovascular Disease

## 2015-06-07 ENCOUNTER — Other Ambulatory Visit: Payer: Self-pay | Admitting: Family Medicine

## 2015-06-07 DIAGNOSIS — M79605 Pain in left leg: Secondary | ICD-10-CM | POA: Diagnosis not present

## 2015-06-07 DIAGNOSIS — I10 Essential (primary) hypertension: Secondary | ICD-10-CM | POA: Insufficient documentation

## 2015-06-07 DIAGNOSIS — M79662 Pain in left lower leg: Secondary | ICD-10-CM

## 2015-06-07 DIAGNOSIS — M7989 Other specified soft tissue disorders: Secondary | ICD-10-CM | POA: Insufficient documentation

## 2015-06-07 DIAGNOSIS — E119 Type 2 diabetes mellitus without complications: Secondary | ICD-10-CM | POA: Insufficient documentation

## 2015-06-20 ENCOUNTER — Encounter (HOSPITAL_BASED_OUTPATIENT_CLINIC_OR_DEPARTMENT_OTHER): Payer: Medicare Other | Attending: Surgery

## 2015-08-17 DIAGNOSIS — I1310 Hypertensive heart and chronic kidney disease without heart failure, with stage 1 through stage 4 chronic kidney disease, or unspecified chronic kidney disease: Secondary | ICD-10-CM | POA: Diagnosis not present

## 2015-08-17 DIAGNOSIS — E119 Type 2 diabetes mellitus without complications: Secondary | ICD-10-CM | POA: Diagnosis not present

## 2015-08-17 DIAGNOSIS — E039 Hypothyroidism, unspecified: Secondary | ICD-10-CM | POA: Diagnosis not present

## 2015-08-17 DIAGNOSIS — Z9981 Dependence on supplemental oxygen: Secondary | ICD-10-CM | POA: Diagnosis not present

## 2015-09-12 DIAGNOSIS — Z0000 Encounter for general adult medical examination without abnormal findings: Secondary | ICD-10-CM | POA: Diagnosis not present

## 2015-10-06 ENCOUNTER — Other Ambulatory Visit: Payer: Self-pay

## 2015-12-05 DIAGNOSIS — H01021 Squamous blepharitis right upper eyelid: Secondary | ICD-10-CM | POA: Diagnosis not present

## 2015-12-05 DIAGNOSIS — H10413 Chronic giant papillary conjunctivitis, bilateral: Secondary | ICD-10-CM | POA: Diagnosis not present

## 2015-12-05 DIAGNOSIS — H01025 Squamous blepharitis left lower eyelid: Secondary | ICD-10-CM | POA: Diagnosis not present

## 2015-12-05 DIAGNOSIS — D3132 Benign neoplasm of left choroid: Secondary | ICD-10-CM | POA: Diagnosis not present

## 2015-12-05 DIAGNOSIS — H01024 Squamous blepharitis left upper eyelid: Secondary | ICD-10-CM | POA: Diagnosis not present

## 2015-12-05 DIAGNOSIS — H01022 Squamous blepharitis right lower eyelid: Secondary | ICD-10-CM | POA: Diagnosis not present

## 2015-12-05 DIAGNOSIS — H353132 Nonexudative age-related macular degeneration, bilateral, intermediate dry stage: Secondary | ICD-10-CM | POA: Diagnosis not present

## 2015-12-05 DIAGNOSIS — E119 Type 2 diabetes mellitus without complications: Secondary | ICD-10-CM | POA: Diagnosis not present

## 2016-01-04 DIAGNOSIS — E119 Type 2 diabetes mellitus without complications: Secondary | ICD-10-CM | POA: Diagnosis not present

## 2016-01-04 DIAGNOSIS — E039 Hypothyroidism, unspecified: Secondary | ICD-10-CM | POA: Diagnosis not present

## 2016-01-04 DIAGNOSIS — I1310 Hypertensive heart and chronic kidney disease without heart failure, with stage 1 through stage 4 chronic kidney disease, or unspecified chronic kidney disease: Secondary | ICD-10-CM | POA: Diagnosis not present

## 2016-01-04 DIAGNOSIS — M109 Gout, unspecified: Secondary | ICD-10-CM | POA: Diagnosis not present

## 2016-01-04 DIAGNOSIS — L0390 Cellulitis, unspecified: Secondary | ICD-10-CM | POA: Diagnosis not present

## 2016-01-10 DIAGNOSIS — M79606 Pain in leg, unspecified: Secondary | ICD-10-CM | POA: Diagnosis not present

## 2016-01-25 DIAGNOSIS — E119 Type 2 diabetes mellitus without complications: Secondary | ICD-10-CM | POA: Diagnosis not present

## 2016-01-25 DIAGNOSIS — E039 Hypothyroidism, unspecified: Secondary | ICD-10-CM | POA: Diagnosis not present

## 2016-01-25 DIAGNOSIS — I10 Essential (primary) hypertension: Secondary | ICD-10-CM | POA: Diagnosis not present

## 2016-01-25 DIAGNOSIS — I1310 Hypertensive heart and chronic kidney disease without heart failure, with stage 1 through stage 4 chronic kidney disease, or unspecified chronic kidney disease: Secondary | ICD-10-CM | POA: Diagnosis not present

## 2016-03-28 DIAGNOSIS — K5900 Constipation, unspecified: Secondary | ICD-10-CM | POA: Diagnosis not present

## 2016-03-28 DIAGNOSIS — M25511 Pain in right shoulder: Secondary | ICD-10-CM | POA: Diagnosis not present

## 2016-04-25 ENCOUNTER — Emergency Department (HOSPITAL_COMMUNITY)
Admission: EM | Admit: 2016-04-25 | Discharge: 2016-04-25 | Disposition: A | Discharge status: Home / Self Care | Payer: Medicare Other | Attending: Emergency Medicine | Admitting: Emergency Medicine

## 2016-04-25 ENCOUNTER — Emergency Department (HOSPITAL_COMMUNITY): Payer: Medicare Other

## 2016-04-25 ENCOUNTER — Encounter (HOSPITAL_COMMUNITY): Payer: Self-pay

## 2016-04-25 DIAGNOSIS — J209 Acute bronchitis, unspecified: Secondary | ICD-10-CM

## 2016-04-25 DIAGNOSIS — Z79899 Other long term (current) drug therapy: Secondary | ICD-10-CM | POA: Insufficient documentation

## 2016-04-25 DIAGNOSIS — N890 Mild vaginal dysplasia: Secondary | ICD-10-CM | POA: Diagnosis not present

## 2016-04-25 DIAGNOSIS — Z720 Tobacco use: Secondary | ICD-10-CM

## 2016-04-25 DIAGNOSIS — I129 Hypertensive chronic kidney disease with stage 1 through stage 4 chronic kidney disease, or unspecified chronic kidney disease: Secondary | ICD-10-CM | POA: Insufficient documentation

## 2016-04-25 DIAGNOSIS — G4730 Sleep apnea, unspecified: Secondary | ICD-10-CM | POA: Diagnosis not present

## 2016-04-25 DIAGNOSIS — E876 Hypokalemia: Secondary | ICD-10-CM | POA: Insufficient documentation

## 2016-04-25 DIAGNOSIS — R079 Chest pain, unspecified: Secondary | ICD-10-CM | POA: Diagnosis present

## 2016-04-25 DIAGNOSIS — F17200 Nicotine dependence, unspecified, uncomplicated: Secondary | ICD-10-CM | POA: Insufficient documentation

## 2016-04-25 DIAGNOSIS — J441 Chronic obstructive pulmonary disease with (acute) exacerbation: Secondary | ICD-10-CM | POA: Insufficient documentation

## 2016-04-25 DIAGNOSIS — E662 Morbid (severe) obesity with alveolar hypoventilation: Secondary | ICD-10-CM | POA: Diagnosis not present

## 2016-04-25 DIAGNOSIS — R52 Pain, unspecified: Secondary | ICD-10-CM | POA: Diagnosis not present

## 2016-04-25 DIAGNOSIS — E1122 Type 2 diabetes mellitus with diabetic chronic kidney disease: Secondary | ICD-10-CM | POA: Diagnosis not present

## 2016-04-25 DIAGNOSIS — I10 Essential (primary) hypertension: Secondary | ICD-10-CM | POA: Diagnosis not present

## 2016-04-25 DIAGNOSIS — N184 Chronic kidney disease, stage 4 (severe): Secondary | ICD-10-CM | POA: Insufficient documentation

## 2016-04-25 DIAGNOSIS — R0602 Shortness of breath: Secondary | ICD-10-CM | POA: Diagnosis not present

## 2016-04-25 DIAGNOSIS — E088 Diabetes mellitus due to underlying condition with unspecified complications: Secondary | ICD-10-CM | POA: Diagnosis not present

## 2016-04-25 LAB — BASIC METABOLIC PANEL
Anion gap: 13 (ref 5–15)
BUN: 33 mg/dL — ABNORMAL HIGH (ref 6–20)
CHLORIDE: 90 mmol/L — AB (ref 101–111)
CO2: 28 mmol/L (ref 22–32)
CREATININE: 2.16 mg/dL — AB (ref 0.44–1.00)
Calcium: 9.1 mg/dL (ref 8.9–10.3)
GFR calc non Af Amer: 20 mL/min — ABNORMAL LOW (ref >60)
GFR, EST AFRICAN AMERICAN: 23 mL/min — AB (ref >60)
GLUCOSE: 212 mg/dL — AB (ref 65–99)
Potassium: 2.6 mmol/L — CL (ref 3.5–5.1)
Sodium: 131 mmol/L — ABNORMAL LOW (ref 135–145)

## 2016-04-25 LAB — CBC
HCT: 37.2 % (ref 36.0–46.0)
HEMOGLOBIN: 12.6 g/dL (ref 12.0–15.0)
MCH: 30 pg (ref 26.0–34.0)
MCHC: 33.9 g/dL (ref 30.0–36.0)
MCV: 88.6 fL (ref 78.0–100.0)
PLATELETS: 290 10*3/uL (ref 150–400)
RBC: 4.2 MIL/uL (ref 3.87–5.11)
RDW: 13.4 % (ref 11.5–15.5)
WBC: 15.9 10*3/uL — ABNORMAL HIGH (ref 4.0–10.5)

## 2016-04-25 LAB — I-STAT TROPONIN, ED: Troponin i, poc: 0.02 ng/mL (ref 0.00–0.08)

## 2016-04-25 MED ORDER — ALBUTEROL (5 MG/ML) CONTINUOUS INHALATION SOLN
10.0000 mg/h | INHALATION_SOLUTION | RESPIRATORY_TRACT | Status: DC
  Administered 2016-04-25: 10 mg/h via RESPIRATORY_TRACT
  Filled 2016-04-25: qty 20

## 2016-04-25 MED ORDER — PREDNISONE 10 MG (21) PO TBPK: ORAL_TABLET | ORAL | 0 refills | Status: DC

## 2016-04-25 MED ORDER — AZITHROMYCIN 250 MG PO TABS: 250.0000 mg | ORAL_TABLET | Freq: Every day | ORAL | 0 refills | Status: DC

## 2016-04-25 MED ORDER — METHYLPREDNISOLONE SODIUM SUCC 125 MG IJ SOLR
125.0000 mg | Freq: Once | INTRAMUSCULAR | Status: AC
  Administered 2016-04-25: 125 mg via INTRAVENOUS
  Filled 2016-04-25: qty 2

## 2016-04-25 MED ORDER — POTASSIUM CHLORIDE CRYS ER 20 MEQ PO TBCR
40.0000 meq | EXTENDED_RELEASE_TABLET | Freq: Once | ORAL | Status: AC
  Administered 2016-04-25: 40 meq via ORAL
  Filled 2016-04-25: qty 2

## 2016-04-25 NOTE — ED Notes (Signed)
Patient requested that the hour long neb mask be removed, it was making her panicky. Medication was finished and mask removed

## 2016-04-25 NOTE — ED Triage Notes (Signed)
Pt was sent by her PCP for elevation on her EKG, CP and SOB. However, pt denies having any chest pain. Her SOB started about 2 weeks ago and has been intermittent.

## 2016-04-25 NOTE — ED Provider Notes (Signed)
[facility] DEPT Provider Note   CSN: [id number] Arrival date & time: 04/25/16  1724     History   Chief Complaint Chief Complaint  Patient presents with  . Chest Pain    HPI Kristy Mcdonald is a 83 y.o. female.  Pt presents to the ED today with sob.  She said she's had sob for the past 2 weeks.  She has an inhaler that she has been using a lot.  She did go to her pcp's office today who sent her here due to her HR elevation.  Pt denies CP.  She denies f/c.   She has had some productive cough.  Pt does have oxygen which she wears at night.      Past Medical History:  Diagnosis Date  . Diabetes mellitus without complication ([place])   . Hypertension   . Thyroid disease     There are no active problems to display for this patient.   History reviewed. No pertinent surgical history.  OB History    No data available       Home Medications    Prior to Admission medications   Medication Sig Start Date End Date Taking? Authorizing Provider  albuterol (PROVENTIL HFA;VENTOLIN HFA) 108 (90 BASE) MCG/ACT inhaler Inhale 2 puffs into the lungs every 6 (six) hours as needed for wheezing or shortness of breath.    Historical Provider, MD  amitriptyline (ELAVIL) 10 MG tablet Take 10 mg by mouth at bedtime.    Historical Provider, MD  azithromycin (ZITHROMAX) 250 MG tablet Take 1 tablet (250 mg total) by mouth daily. Take first 2 tablets together, then 1 every day until finished. 04/25/16   Isla Pence, MD  diazepam (VALIUM) 5 MG tablet Take 2.5 mg by mouth at bedtime. Take every night per patient    Historical Provider, MD  furosemide (LASIX) 20 MG tablet Take 20 mg by mouth daily.    Historical Provider, MD  glimepiride (AMARYL) 4 MG tablet Take 4 mg by mouth daily with breakfast.    Historical Provider, MD  levothyroxine (SYNTHROID, LEVOTHROID) 50 MCG tablet Take 50 mcg by mouth daily before breakfast.    Historical Provider, MD  metFORMIN (GLUMETZA) 1000 MG (MOD) 24 hr  tablet Take 1,000 mg by mouth daily with breakfast.    Historical Provider, MD  metolazone (ZAROXOLYN) 2.5 MG tablet Take 2.5 mg by mouth daily.    Historical Provider, MD  Polyethyl Glycol-Propyl Glycol (SYSTANE FREE OP) Place 1 drop into both eyes daily as needed. For dry eyes    Historical Provider, MD  potassium chloride SA (K-DUR,KLOR-CON) 20 MEQ tablet Take 20 mEq by mouth daily.    Historical Provider, MD  predniSONE (STERAPRED UNI-PAK 21 TAB) 10 MG (21) TBPK tablet Take 6 tabs by mouth daily  for 2 days, then 5 tabs for 2 days, then 4 tabs for 2 days, then 3 tabs for 2 days, 2 tabs for 2 days, then 1 tab by mouth daily for 2 days 04/25/16   Isla Pence, MD  tiotropium (SPIRIVA HANDIHALER) 18 MCG inhalation capsule Place 18 mcg into inhaler and inhale daily.    Historical Provider, MD    Family History No family history on file.  Social History Social History  Substance Use Topics  . Smoking status: Current Every Day Smoker  . Smokeless tobacco: Never Used  . Alcohol use No     Allergies   Nicotine   Review of Systems Review of Systems  Respiratory: Positive for  cough and shortness of breath.   All other systems reviewed and are negative.    Physical Exam Updated Vital Signs BP 119/79   Pulse (!) 107   Temp 98.5 F (36.9 C) (Oral)   Resp (!) 30   Ht 5\' 5"  (1.651 m)   Wt 198 lb (89.8 kg)   SpO2 100%   BMI 32.95 kg/m   Physical Exam  Constitutional: She is oriented to person, place, and time. She appears well-developed and well-nourished.  HENT:  Head: Normocephalic and atraumatic.  Right Ear: External ear normal.  Left Ear: External ear normal.  Nose: Nose normal.  Mouth/Throat: Oropharynx is clear and moist.  Eyes: Conjunctivae and EOM are normal. Pupils are equal, round, and reactive to light.  Neck: Normal range of motion. Neck supple.  Cardiovascular: Regular rhythm, normal heart sounds and intact distal pulses.  Tachycardia present.     Pulmonary/Chest: She has wheezes.  Abdominal: Soft. Bowel sounds are normal.  Musculoskeletal: Normal range of motion.  Neurological: She is alert and oriented to person, place, and time.  Skin: Skin is warm.  Psychiatric: She has a normal mood and affect. Her behavior is normal. Judgment and thought content normal.  Nursing note and vitals reviewed.    ED Treatments / Results  Labs (all labs ordered are listed, but only abnormal results are displayed) Labs Reviewed  BASIC METABOLIC PANEL - Abnormal; Notable for the following:       Result Value   Sodium 131 (*)    Potassium 2.6 (*)    Chloride 90 (*)    Glucose, Bld 212 (*)    BUN 33 (*)    Creatinine, Ser 2.16 (*)    GFR calc non Af Amer 20 (*)    GFR calc Af Amer 23 (*)    All other components within normal limits  CBC - Abnormal; Notable for the following:    WBC 15.9 (*)    All other components within normal limits  I-STAT TROPOININ, ED    EKG  EKG Interpretation  Date/Time:  Thursday April 25 2016 17:29:52 EDT Ventricular Rate:  113 PR Interval:  190 QRS Duration: 74 QT Interval:  320 QTC Calculation: 438 R Axis:   72 Text Interpretation:  Sinus tachycardia Low voltage QRS Borderline ECG Confirmed by Bellanie Matthew MD, Tobi Groesbeck ([id number]) on 04/25/2016 6:01:46 PM       Radiology Dg Chest 2 View  Result Date: 04/25/2016 CLINICAL DATA:  Subacute onset of generalized chest tightness and shortness of breath. Initial encounter. EXAM: CHEST  2 VIEW COMPARISON:  CTA of the chest performed 03/16/2006, and chest radiograph performed 03/15/2006 FINDINGS: The lungs are well-aerated. Peribronchial thickening is noted. Mild bilateral chronic scarring is noted. There is no evidence of pleural effusion or pneumothorax. The heart is normal in size; the mediastinal contour is within normal limits. No acute osseous abnormalities are seen. IMPRESSION: Peribronchial thickening noted. Mild bilateral chronic scarring noted. Lungs otherwise  grossly clear. Electronically Signed   By: Garald Balding M.D.   On: 04/25/2016 18:33    Procedures Procedures (including critical care time)  Medications Ordered in ED Medications  albuterol (PROVENTIL,VENTOLIN) solution continuous neb (0 mg/hr Nebulization Stopped 04/25/16 1937)  methylPREDNISolone sodium succinate (SOLU-MEDROL) 125 mg/2 mL injection 125 mg (125 mg Intravenous Given 04/25/16 1903)  potassium chloride SA (K-DUR,KLOR-CON) CR tablet 40 mEq (40 mEq Oral Given 04/25/16 1920)     Initial Impression / Assessment and Plan / ED Course  I have reviewed the  triage vital signs and the nursing notes.  Pertinent labs & imaging results that were available during my care of the patient were reviewed by me and considered in my medical decision making (see chart for details).    Pt had a 1 hr continuous neb and 125 of solumedrol.  She is feeling better.  She wants to go home.  Her potassium was low, so she was given 40 meq here.  She has potassium at home she said she could take.  She was offered admission, but did not want to stay.  I encouraged her to stop smoking.  She does have home O2.  She knows to return if worse and to f/u with pcp.  Final Clinical Impressions(s) / ED Diagnoses   Final diagnoses:  Acute bronchitis, unspecified organism  COPD exacerbation (HCC)  Hypokalemia  Tobacco abuse  CRI (chronic renal insufficiency), stage 4 (severe) ([place])    New Prescriptions Discharge Medication List as of 04/25/2016  7:33 PM    START taking these medications   Details  azithromycin (ZITHROMAX) 250 MG tablet Take 1 tablet (250 mg total) by mouth daily. Take first 2 tablets together, then 1 every day until finished., Starting Thu 04/25/2016, Print    predniSONE (STERAPRED UNI-PAK 21 TAB) 10 MG (21) TBPK tablet Take 6 tabs by mouth daily  for 2 days, then 5 tabs for 2 days, then 4 tabs for 2 days, then 3 tabs for 2 days, 2 tabs for 2 days, then 1 tab by mouth daily for 2 days, Print           Isla Pence, MD 04/25/16 1954

## 2016-04-30 DIAGNOSIS — N189 Chronic kidney disease, unspecified: Secondary | ICD-10-CM | POA: Diagnosis not present

## 2016-04-30 DIAGNOSIS — J208 Acute bronchitis due to other specified organisms: Secondary | ICD-10-CM | POA: Diagnosis not present

## 2016-04-30 DIAGNOSIS — I1310 Hypertensive heart and chronic kidney disease without heart failure, with stage 1 through stage 4 chronic kidney disease, or unspecified chronic kidney disease: Secondary | ICD-10-CM | POA: Diagnosis not present

## 2016-04-30 DIAGNOSIS — E039 Hypothyroidism, unspecified: Secondary | ICD-10-CM | POA: Diagnosis not present

## 2016-05-16 DIAGNOSIS — L0390 Cellulitis, unspecified: Secondary | ICD-10-CM | POA: Diagnosis not present

## 2016-05-21 DIAGNOSIS — E119 Type 2 diabetes mellitus without complications: Secondary | ICD-10-CM | POA: Diagnosis not present

## 2016-05-21 DIAGNOSIS — M6281 Muscle weakness (generalized): Secondary | ICD-10-CM | POA: Diagnosis not present

## 2016-05-21 DIAGNOSIS — I10 Essential (primary) hypertension: Secondary | ICD-10-CM | POA: Diagnosis not present

## 2016-05-21 DIAGNOSIS — J449 Chronic obstructive pulmonary disease, unspecified: Secondary | ICD-10-CM | POA: Diagnosis not present

## 2016-05-21 DIAGNOSIS — L03115 Cellulitis of right lower limb: Secondary | ICD-10-CM | POA: Diagnosis not present

## 2016-05-21 DIAGNOSIS — L0390 Cellulitis, unspecified: Secondary | ICD-10-CM | POA: Diagnosis not present

## 2016-05-21 DIAGNOSIS — E785 Hyperlipidemia, unspecified: Secondary | ICD-10-CM | POA: Diagnosis not present

## 2016-05-22 DIAGNOSIS — L03115 Cellulitis of right lower limb: Secondary | ICD-10-CM | POA: Diagnosis not present

## 2016-05-22 DIAGNOSIS — M6281 Muscle weakness (generalized): Secondary | ICD-10-CM | POA: Diagnosis not present

## 2016-05-22 DIAGNOSIS — E119 Type 2 diabetes mellitus without complications: Secondary | ICD-10-CM | POA: Diagnosis not present

## 2016-05-22 DIAGNOSIS — I10 Essential (primary) hypertension: Secondary | ICD-10-CM | POA: Diagnosis not present

## 2016-05-22 DIAGNOSIS — E785 Hyperlipidemia, unspecified: Secondary | ICD-10-CM | POA: Diagnosis not present

## 2016-05-22 DIAGNOSIS — J449 Chronic obstructive pulmonary disease, unspecified: Secondary | ICD-10-CM | POA: Diagnosis not present

## 2016-05-23 DIAGNOSIS — J449 Chronic obstructive pulmonary disease, unspecified: Secondary | ICD-10-CM | POA: Diagnosis not present

## 2016-05-23 DIAGNOSIS — I10 Essential (primary) hypertension: Secondary | ICD-10-CM | POA: Diagnosis not present

## 2016-05-23 DIAGNOSIS — E119 Type 2 diabetes mellitus without complications: Secondary | ICD-10-CM | POA: Diagnosis not present

## 2016-05-23 DIAGNOSIS — M6281 Muscle weakness (generalized): Secondary | ICD-10-CM | POA: Diagnosis not present

## 2016-05-23 DIAGNOSIS — L03115 Cellulitis of right lower limb: Secondary | ICD-10-CM | POA: Diagnosis not present

## 2016-05-23 DIAGNOSIS — E785 Hyperlipidemia, unspecified: Secondary | ICD-10-CM | POA: Diagnosis not present

## 2016-05-24 DIAGNOSIS — E119 Type 2 diabetes mellitus without complications: Secondary | ICD-10-CM | POA: Diagnosis not present

## 2016-05-24 DIAGNOSIS — I10 Essential (primary) hypertension: Secondary | ICD-10-CM | POA: Diagnosis not present

## 2016-05-24 DIAGNOSIS — J449 Chronic obstructive pulmonary disease, unspecified: Secondary | ICD-10-CM | POA: Diagnosis not present

## 2016-05-24 DIAGNOSIS — M6281 Muscle weakness (generalized): Secondary | ICD-10-CM | POA: Diagnosis not present

## 2016-05-24 DIAGNOSIS — L03115 Cellulitis of right lower limb: Secondary | ICD-10-CM | POA: Diagnosis not present

## 2016-05-24 DIAGNOSIS — E785 Hyperlipidemia, unspecified: Secondary | ICD-10-CM | POA: Diagnosis not present

## 2016-05-25 DIAGNOSIS — L03115 Cellulitis of right lower limb: Secondary | ICD-10-CM | POA: Diagnosis not present

## 2016-05-25 DIAGNOSIS — I10 Essential (primary) hypertension: Secondary | ICD-10-CM | POA: Diagnosis not present

## 2016-05-25 DIAGNOSIS — J449 Chronic obstructive pulmonary disease, unspecified: Secondary | ICD-10-CM | POA: Diagnosis not present

## 2016-05-25 DIAGNOSIS — M6281 Muscle weakness (generalized): Secondary | ICD-10-CM | POA: Diagnosis not present

## 2016-05-25 DIAGNOSIS — E785 Hyperlipidemia, unspecified: Secondary | ICD-10-CM | POA: Diagnosis not present

## 2016-05-25 DIAGNOSIS — E119 Type 2 diabetes mellitus without complications: Secondary | ICD-10-CM | POA: Diagnosis not present

## 2016-05-26 DIAGNOSIS — L03115 Cellulitis of right lower limb: Secondary | ICD-10-CM | POA: Diagnosis not present

## 2016-05-26 DIAGNOSIS — I10 Essential (primary) hypertension: Secondary | ICD-10-CM | POA: Diagnosis not present

## 2016-05-26 DIAGNOSIS — M6281 Muscle weakness (generalized): Secondary | ICD-10-CM | POA: Diagnosis not present

## 2016-05-26 DIAGNOSIS — J449 Chronic obstructive pulmonary disease, unspecified: Secondary | ICD-10-CM | POA: Diagnosis not present

## 2016-05-26 DIAGNOSIS — E785 Hyperlipidemia, unspecified: Secondary | ICD-10-CM | POA: Diagnosis not present

## 2016-05-26 DIAGNOSIS — E119 Type 2 diabetes mellitus without complications: Secondary | ICD-10-CM | POA: Diagnosis not present

## 2016-05-27 ENCOUNTER — Observation Stay (HOSPITAL_COMMUNITY)
Admission: EM | Admit: 2016-05-27 | Discharge: 2016-05-29 | Disposition: A | Discharge status: Home / Self Care | Payer: Medicare Other | Attending: Internal Medicine | Admitting: Internal Medicine

## 2016-05-27 ENCOUNTER — Emergency Department (HOSPITAL_COMMUNITY): Payer: Medicare Other

## 2016-05-27 ENCOUNTER — Encounter (HOSPITAL_COMMUNITY): Payer: Self-pay | Admitting: Emergency Medicine

## 2016-05-27 DIAGNOSIS — I129 Hypertensive chronic kidney disease with stage 1 through stage 4 chronic kidney disease, or unspecified chronic kidney disease: Secondary | ICD-10-CM | POA: Diagnosis not present

## 2016-05-27 DIAGNOSIS — E11319 Type 2 diabetes mellitus with unspecified diabetic retinopathy without macular edema: Secondary | ICD-10-CM | POA: Diagnosis not present

## 2016-05-27 DIAGNOSIS — R0602 Shortness of breath: Secondary | ICD-10-CM | POA: Diagnosis not present

## 2016-05-27 DIAGNOSIS — Z79899 Other long term (current) drug therapy: Secondary | ICD-10-CM | POA: Insufficient documentation

## 2016-05-27 DIAGNOSIS — E785 Hyperlipidemia, unspecified: Secondary | ICD-10-CM | POA: Diagnosis not present

## 2016-05-27 DIAGNOSIS — E039 Hypothyroidism, unspecified: Secondary | ICD-10-CM | POA: Diagnosis not present

## 2016-05-27 DIAGNOSIS — M6281 Muscle weakness (generalized): Secondary | ICD-10-CM | POA: Diagnosis not present

## 2016-05-27 DIAGNOSIS — I10 Essential (primary) hypertension: Secondary | ICD-10-CM | POA: Diagnosis not present

## 2016-05-27 DIAGNOSIS — Z9181 History of falling: Secondary | ICD-10-CM | POA: Insufficient documentation

## 2016-05-27 DIAGNOSIS — N184 Chronic kidney disease, stage 4 (severe): Secondary | ICD-10-CM | POA: Diagnosis not present

## 2016-05-27 DIAGNOSIS — R0600 Dyspnea, unspecified: Secondary | ICD-10-CM

## 2016-05-27 DIAGNOSIS — E871 Hypo-osmolality and hyponatremia: Secondary | ICD-10-CM

## 2016-05-27 DIAGNOSIS — E1122 Type 2 diabetes mellitus with diabetic chronic kidney disease: Secondary | ICD-10-CM | POA: Insufficient documentation

## 2016-05-27 DIAGNOSIS — E876 Hypokalemia: Secondary | ICD-10-CM | POA: Diagnosis not present

## 2016-05-27 DIAGNOSIS — J449 Chronic obstructive pulmonary disease, unspecified: Secondary | ICD-10-CM

## 2016-05-27 DIAGNOSIS — S299XXA Unspecified injury of thorax, initial encounter: Secondary | ICD-10-CM | POA: Diagnosis not present

## 2016-05-27 DIAGNOSIS — I4891 Unspecified atrial fibrillation: Principal | ICD-10-CM

## 2016-05-27 DIAGNOSIS — E119 Type 2 diabetes mellitus without complications: Secondary | ICD-10-CM | POA: Diagnosis not present

## 2016-05-27 DIAGNOSIS — E860 Dehydration: Secondary | ICD-10-CM | POA: Diagnosis not present

## 2016-05-27 DIAGNOSIS — F17200 Nicotine dependence, unspecified, uncomplicated: Secondary | ICD-10-CM | POA: Diagnosis not present

## 2016-05-27 DIAGNOSIS — L03115 Cellulitis of right lower limb: Secondary | ICD-10-CM | POA: Diagnosis not present

## 2016-05-27 DIAGNOSIS — R000 Tachycardia, unspecified: Secondary | ICD-10-CM | POA: Diagnosis present

## 2016-05-27 HISTORY — DX: Chronic obstructive pulmonary disease, unspecified: J44.9

## 2016-05-27 HISTORY — DX: Type 2 diabetes mellitus without complications: E11.9

## 2016-05-27 HISTORY — DX: Hypothyroidism, unspecified: E03.9

## 2016-05-27 LAB — I-STAT TROPONIN, ED: Troponin i, poc: 0.02 ng/mL (ref 0.00–0.08)

## 2016-05-27 LAB — CBC
HEMATOCRIT: 37.6 % (ref 36.0–46.0)
HEMOGLOBIN: 12.5 g/dL (ref 12.0–15.0)
MCH: 30.2 pg (ref 26.0–34.0)
MCHC: 33.2 g/dL (ref 30.0–36.0)
MCV: 90.8 fL (ref 78.0–100.0)
Platelets: 340 10*3/uL (ref 150–400)
RBC: 4.14 MIL/uL (ref 3.87–5.11)
RDW: 14 % (ref 11.5–15.5)
WBC: 9.1 10*3/uL (ref 4.0–10.5)

## 2016-05-27 LAB — BASIC METABOLIC PANEL
ANION GAP: 14 (ref 5–15)
BUN: 25 mg/dL — AB (ref 6–20)
CO2: 28 mmol/L (ref 22–32)
Calcium: 10.5 mg/dL — ABNORMAL HIGH (ref 8.9–10.3)
Chloride: 92 mmol/L — ABNORMAL LOW (ref 101–111)
Creatinine, Ser: 1.87 mg/dL — ABNORMAL HIGH (ref 0.44–1.00)
GFR calc Af Amer: 28 mL/min — ABNORMAL LOW (ref >60)
GFR calc non Af Amer: 24 mL/min — ABNORMAL LOW (ref >60)
GLUCOSE: 223 mg/dL — AB (ref 65–99)
POTASSIUM: 2.7 mmol/L — AB (ref 3.5–5.1)
Sodium: 134 mmol/L — ABNORMAL LOW (ref 135–145)

## 2016-05-27 MED ORDER — SODIUM CHLORIDE 0.9 % IV BOLUS (SEPSIS)
1000.0000 mL | Freq: Once | INTRAVENOUS | Status: AC
  Administered 2016-05-27: 1000 mL via INTRAVENOUS

## 2016-05-27 MED ORDER — SODIUM CHLORIDE 0.9 % IV SOLN
Freq: Once | INTRAVENOUS | Status: AC
  Administered 2016-05-27: 23:00:00 via INTRAVENOUS
  Filled 2016-05-27: qty 1000

## 2016-05-27 MED ORDER — DILTIAZEM HCL-DEXTROSE 100-5 MG/100ML-% IV SOLN (PREMIX)
5.0000 mg/h | Freq: Once | INTRAVENOUS | Status: AC
  Administered 2016-05-27: 5 mg/h via INTRAVENOUS
  Filled 2016-05-27: qty 100

## 2016-05-27 MED ORDER — HEPARIN BOLUS VIA INFUSION
4000.0000 [IU] | Freq: Once | INTRAVENOUS | Status: AC
  Administered 2016-05-27: 4000 [IU] via INTRAVENOUS
  Filled 2016-05-27: qty 4000

## 2016-05-27 MED ORDER — LORAZEPAM 2 MG/ML IJ SOLN
1.0000 mg | Freq: Once | INTRAMUSCULAR | Status: AC
  Administered 2016-05-27: 1 mg via INTRAVENOUS
  Filled 2016-05-27: qty 1

## 2016-05-27 MED ORDER — DILTIAZEM HCL 25 MG/5ML IV SOLN
10.0000 mg | Freq: Once | INTRAVENOUS | Status: AC
  Administered 2016-05-27: 10 mg via INTRAVENOUS
  Filled 2016-05-27: qty 5

## 2016-05-27 MED ORDER — HEPARIN (PORCINE) IN NACL 100-0.45 UNIT/ML-% IJ SOLN
1000.0000 [IU]/h | INTRAMUSCULAR | Status: DC
  Administered 2016-05-27: 1250 [IU]/h via INTRAVENOUS
  Filled 2016-05-27: qty 250

## 2016-05-27 NOTE — ED Notes (Signed)
Pt st's she is normally on 02 at 2LPM at night time at home.  Pt placed on 02 at 2LPM via [place].

## 2016-05-27 NOTE — ED Provider Notes (Signed)
[facility] DEPT Provider Note   CSN: [id number] Arrival date & time: 05/27/16  1929     History   Chief Complaint Chief Complaint  Patient presents with  . Weakness  . Tachycardia    HPI Kristy Mcdonald is a 83 y.o. female history of diabetes, hypertension, here presenting with shortness of breath, palpitations. Patient has intermittent palpitations for the last week or so. Patient states that the palpitations got worse today and is constant. She has intermittent shortness of breath that is worse with exertion that differently got worse today. She has not been eating and drinking much and just had poor appetite for the last several days. Of note, patient did fall about a week ago and has been having right leg swelling and pain. She recently finished a course of antibiotics for right leg cellulitis but is no longer on antibiotics. Denies hx of CAD or blood clots    The history is provided by the patient.    Past Medical History:  Diagnosis Date  . Diabetes mellitus without complication ([place])   . Hypertension   . Thyroid disease     There are no active problems to display for this patient.   History reviewed. No pertinent surgical history.  OB History    No data available       Home Medications    Prior to Admission medications   Medication Sig Start Date End Date Taking? Authorizing Provider  albuterol (PROVENTIL HFA;VENTOLIN HFA) 108 (90 BASE) MCG/ACT inhaler Inhale 2 puffs into the lungs every 6 (six) hours as needed for wheezing or shortness of breath.    Historical Provider, MD  amitriptyline (ELAVIL) 10 MG tablet Take 10 mg by mouth at bedtime.    Historical Provider, MD  azithromycin (ZITHROMAX) 250 MG tablet Take 1 tablet (250 mg total) by mouth daily. Take first 2 tablets together, then 1 every day until finished. 04/25/16   Isla Pence, MD  diazepam (VALIUM) 5 MG tablet Take 2.5 mg by mouth at bedtime. Take every night per patient    Historical  Provider, MD  furosemide (LASIX) 20 MG tablet Take 20 mg by mouth daily.    Historical Provider, MD  glimepiride (AMARYL) 4 MG tablet Take 4 mg by mouth daily with breakfast.    Historical Provider, MD  levothyroxine (SYNTHROID, LEVOTHROID) 50 MCG tablet Take 50 mcg by mouth daily before breakfast.    Historical Provider, MD  metFORMIN (GLUMETZA) 1000 MG (MOD) 24 hr tablet Take 1,000 mg by mouth daily with breakfast.    Historical Provider, MD  metolazone (ZAROXOLYN) 2.5 MG tablet Take 2.5 mg by mouth daily.    Historical Provider, MD  Polyethyl Glycol-Propyl Glycol (SYSTANE FREE OP) Place 1 drop into both eyes daily as needed. For dry eyes    Historical Provider, MD  potassium chloride SA (K-DUR,KLOR-CON) 20 MEQ tablet Take 20 mEq by mouth daily.    Historical Provider, MD  predniSONE (STERAPRED UNI-PAK 21 TAB) 10 MG (21) TBPK tablet Take 6 tabs by mouth daily  for 2 days, then 5 tabs for 2 days, then 4 tabs for 2 days, then 3 tabs for 2 days, 2 tabs for 2 days, then 1 tab by mouth daily for 2 days 04/25/16   Isla Pence, MD  tiotropium (SPIRIVA HANDIHALER) 18 MCG inhalation capsule Place 18 mcg into inhaler and inhale daily.    Historical Provider, MD    Family History No family history on file.  Social History Social History  Substance Use Topics  . Smoking status: Current Every Day Smoker  . Smokeless tobacco: Never Used  . Alcohol use No     Allergies   Nicotine   Review of Systems Review of Systems  Respiratory: Positive for shortness of breath.   Cardiovascular: Positive for palpitations.  Neurological: Positive for weakness.  All other systems reviewed and are negative.    Physical Exam Updated Vital Signs BP (!) 123/96 (BP Location: Right Arm)   Pulse (!) 152   Temp 98 F (36.7 C) (Oral)   Ht 5\' 5"  (1.651 m)   Wt 198 lb (89.8 kg)   SpO2 94%   BMI 32.95 kg/m   Physical Exam  Constitutional: She is oriented to person, place, and time.  Dehydrated,  chronically ill   HENT:  Head: Normocephalic.  MM dry   Eyes: EOM are normal. Pupils are equal, round, and reactive to light.  Neck: Normal range of motion. Neck supple.  Cardiovascular:  Tachycardic, irregular   Pulmonary/Chest:  tachypneic, diminished bilateral bases   Abdominal: Soft. Bowel sounds are normal. She exhibits no distension.  Musculoskeletal:  + calf tenderness R leg, no obvious cellulitis   Neurological: She is alert and oriented to person, place, and time.  Skin: Skin is warm.  Psychiatric: She has a normal mood and affect.  Nursing note and vitals reviewed.    ED Treatments / Results  Labs (all labs ordered are listed, but only abnormal results are displayed) Labs Reviewed  BASIC METABOLIC PANEL - Abnormal; Notable for the following:       Result Value   Sodium 134 (*)    Potassium 2.7 (*)    Chloride 92 (*)    Glucose, Bld 223 (*)    BUN 25 (*)    Creatinine, Ser 1.87 (*)    Calcium 10.5 (*)    GFR calc non Af Amer 24 (*)    GFR calc Af Amer 28 (*)    All other components within normal limits  CBC  I-STAT TROPOININ, ED    EKG  EKG Interpretation  Date/Time:  Monday May 27 2016 19:35:42 EDT Ventricular Rate:  158 PR Interval:    QRS Duration: 78 QT Interval:  298 QTC Calculation: 483 R Axis:   71 Text Interpretation:  Atrial fibrillation with rapid ventricular response with premature ventricular or aberrantly conducted complexes Low voltage QRS Cannot rule out Anteroseptal infarct , age undetermined Abnormal ECG previous EKG showed sinus rhythm Confirmed by Deshaun Weisinger  MD, Kofi Murrell ([id number]) on 05/27/2016 7:50:26 PM       Radiology Dg Chest Portable 1 View  Result Date: 05/27/2016 CLINICAL DATA:  Tachycardia, feeling unwell. In atrial fibrillation. Recent falls. EXAM: PORTABLE CHEST 1 VIEW COMPARISON:  Chest radiograph April 25, 2016 FINDINGS: Cardiac silhouette is mildly enlarged. Mediastinal silhouette is nonsuspicious. Mildly calcified aortic knob.  No pleural effusion or focal consolidation, improved aeration from prior imaging. No pneumothorax. Osteopenia. Soft tissue planes are nonsuspicious. IMPRESSION: Mild cardiomegaly.  No acute pulmonary process. Electronically Signed   By: Elon Alas M.D.   On: 05/27/2016 20:18    Procedures Procedures (including critical care time)  CRITICAL CARE Performed by: Wandra Arthurs   Total critical care time: 30 minutes  Critical care time was exclusive of separately billable procedures and treating other patients.  Critical care was necessary to treat or prevent imminent or life-threatening deterioration.  Critical care was time spent personally by me on the following activities: development of treatment plan  with patient and/or surrogate as well as nursing, discussions with consultants, evaluation of patient's response to treatment, examination of patient, obtaining history from patient or surrogate, ordering and performing treatments and interventions, ordering and review of laboratory studies, ordering and review of radiographic studies, pulse oximetry and re-evaluation of patient's condition.  Medications Ordered in ED Medications  sodium chloride 0.9 % 1,000 mL with potassium chloride 80 mEq infusion (not administered)  diltiazem (CARDIZEM) 100 mg in dextrose 5% 166mL (1 mg/mL) infusion (not administered)  sodium chloride 0.9 % bolus 1,000 mL (0 mLs Intravenous Stopped 05/27/16 2136)  diltiazem (CARDIZEM) injection 10 mg (10 mg Intravenous Given 05/27/16 2136)  LORazepam (ATIVAN) injection 1 mg (1 mg Intravenous Given 05/27/16 2124)     Initial Impression / Assessment and Plan / ED Course  I have reviewed the triage vital signs and the nursing notes.  Pertinent labs & imaging results that were available during my care of the patient were reviewed by me and considered in my medical decision making (see chart for details).    Kristy Mcdonald is a 83 y.o. female here with new onset  afib, hypoxic, tachycardia. Had a fall and now has R leg swelling. High suspicion for PE. Will get labs, CT angio chest. Will start on cardizem.   9:51 PM K 2.7, Cr 1.8. This is likely from dehydration. CXR clear. Trop neg. Given cardizem and HR now 110. Will start cardizem drip. Unfortunately, due to acute renal failure, unable to get CT angio chest. Will start therapeutic dose of heparin for possible PE and hydrate patient. Can get VQ vs RLE [place] tomorrow inpatient.    Final Clinical Impressions(s) / ED Diagnoses   Final diagnoses:  None    New Prescriptions New Prescriptions   No medications on file     Drenda Freeze, MD 05/27/16 2211
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
## 2016-05-27 NOTE — Progress Notes (Signed)
ANTICOAGULATION CONSULT NOTE - Initial Consult  Pharmacy Consult for heparin Indication: pulmonary embolus  Allergies  Allergen Reactions  . Nicotine     Unknown    Patient Measurements: Height: 5\' 5"  (165.1 cm) Weight: 198 lb (89.8 kg) IBW/kg (Calculated) : 57 Heparin Dosing Weight: 77 kg  Vital Signs: Temp: 98 F (36.7 C) (04/23 1939) Temp Source: Oral (04/23 1939) BP: 123/96 (04/23 1939) Pulse Rate: 152 (04/23 1939)  Labs:  Recent Labs  05/27/16 1944  HGB 12.5  HCT 37.6  PLT 340  CREATININE 1.87*    Estimated Creatinine Clearance: 26.1 mL/min (A) (by C-G formula based on SCr of 1.87 mg/dL (H)).   Medical History: Past Medical History:  Diagnosis Date  . Diabetes mellitus without complication ([place])   . Hypertension   . Thyroid disease     Medications:   (Not in a hospital admission)  Assessment: Kristy Mcdonald is a 83 y.o. female who presents to the hospital with atrial fibrillation, leg swelling, and hypoxia concerning for possible PE.No anticoagulation prior to admission. Pharmacy asked to dose heparin. HgB 12.5, PLT 340.    Goal of Therapy:  Heparin level 0.3-0.7 units/ml Monitor platelets by anticoagulation protocol: Yes   Plan:  Give 4000 units bolus x 1 Start heparin infusion at 1250 units/hr Check anti-Xa level in 8 hours and daily while on heparin Continue to monitor H&H and platelets  Jerre Vandrunen L Kassadee Carawan 05/27/2016,9:55 PM

## 2016-05-27 NOTE — ED Triage Notes (Signed)
Pt was sent to the ED by provider due to rapid heart rate.  Pt complains of weakness and "not getting better after being sick."  No history of cardiac problems although EKG shows AFIB.  Pt has had two recent falls, she feels it is b/c she has not been eating or drinking due to feeling nausea.

## 2016-05-27 NOTE — ED Notes (Signed)
Pt voided very small amount in bedpan.  Family at bedside

## 2016-05-28 ENCOUNTER — Observation Stay (HOSPITAL_COMMUNITY): Payer: Medicare Other

## 2016-05-28 ENCOUNTER — Encounter (HOSPITAL_COMMUNITY): Payer: Self-pay | Admitting: Physician Assistant

## 2016-05-28 ENCOUNTER — Observation Stay (HOSPITAL_BASED_OUTPATIENT_CLINIC_OR_DEPARTMENT_OTHER): Payer: Medicare Other

## 2016-05-28 DIAGNOSIS — M7989 Other specified soft tissue disorders: Secondary | ICD-10-CM | POA: Diagnosis not present

## 2016-05-28 DIAGNOSIS — R600 Localized edema: Secondary | ICD-10-CM | POA: Diagnosis not present

## 2016-05-28 DIAGNOSIS — N184 Chronic kidney disease, stage 4 (severe): Secondary | ICD-10-CM | POA: Diagnosis not present

## 2016-05-28 DIAGNOSIS — E1122 Type 2 diabetes mellitus with diabetic chronic kidney disease: Secondary | ICD-10-CM | POA: Diagnosis not present

## 2016-05-28 DIAGNOSIS — J449 Chronic obstructive pulmonary disease, unspecified: Secondary | ICD-10-CM | POA: Diagnosis not present

## 2016-05-28 DIAGNOSIS — R0602 Shortness of breath: Secondary | ICD-10-CM | POA: Diagnosis not present

## 2016-05-28 DIAGNOSIS — E11319 Type 2 diabetes mellitus with unspecified diabetic retinopathy without macular edema: Secondary | ICD-10-CM | POA: Diagnosis not present

## 2016-05-28 DIAGNOSIS — Z7901 Long term (current) use of anticoagulants: Secondary | ICD-10-CM

## 2016-05-28 DIAGNOSIS — I4891 Unspecified atrial fibrillation: Secondary | ICD-10-CM | POA: Diagnosis not present

## 2016-05-28 DIAGNOSIS — R05 Cough: Secondary | ICD-10-CM | POA: Diagnosis not present

## 2016-05-28 DIAGNOSIS — I129 Hypertensive chronic kidney disease with stage 1 through stage 4 chronic kidney disease, or unspecified chronic kidney disease: Secondary | ICD-10-CM | POA: Diagnosis not present

## 2016-05-28 DIAGNOSIS — F17200 Nicotine dependence, unspecified, uncomplicated: Secondary | ICD-10-CM | POA: Diagnosis not present

## 2016-05-28 DIAGNOSIS — E876 Hypokalemia: Secondary | ICD-10-CM | POA: Diagnosis not present

## 2016-05-28 DIAGNOSIS — Z5181 Encounter for therapeutic drug level monitoring: Secondary | ICD-10-CM | POA: Diagnosis not present

## 2016-05-28 LAB — BASIC METABOLIC PANEL
ANION GAP: 9 (ref 5–15)
BUN: 22 mg/dL — ABNORMAL HIGH (ref 6–20)
CALCIUM: 9.4 mg/dL (ref 8.9–10.3)
CO2: 30 mmol/L (ref 22–32)
CREATININE: 1.68 mg/dL — AB (ref 0.44–1.00)
Chloride: 99 mmol/L — ABNORMAL LOW (ref 101–111)
GFR calc non Af Amer: 27 mL/min — ABNORMAL LOW (ref >60)
GFR, EST AFRICAN AMERICAN: 32 mL/min — AB (ref >60)
Glucose, Bld: 201 mg/dL — ABNORMAL HIGH (ref 65–99)
Potassium: 3.7 mmol/L (ref 3.5–5.1)
SODIUM: 138 mmol/L (ref 135–145)

## 2016-05-28 LAB — HEPARIN LEVEL (UNFRACTIONATED)
HEPARIN UNFRACTIONATED: 0.95 [IU]/mL — AB (ref 0.30–0.70)
HEPARIN UNFRACTIONATED: 0.9 [IU]/mL — AB (ref 0.30–0.70)

## 2016-05-28 LAB — TROPONIN I: Troponin I: <0.03 ng/mL (ref <0.03)

## 2016-05-28 LAB — MAGNESIUM: Magnesium: 1.6 mg/dL — ABNORMAL LOW (ref 1.7–2.4)

## 2016-05-28 LAB — BRAIN NATRIURETIC PEPTIDE: B NATRIURETIC PEPTIDE 5: 129.2 pg/mL — AB (ref 0.0–100.0)

## 2016-05-28 LAB — CBC
HCT: 33.2 % — ABNORMAL LOW (ref 36.0–46.0)
HEMOGLOBIN: 10.6 g/dL — AB (ref 12.0–15.0)
MCH: 29.3 pg (ref 26.0–34.0)
MCHC: 31.9 g/dL (ref 30.0–36.0)
MCV: 91.7 fL (ref 78.0–100.0)
Platelets: 281 10*3/uL (ref 150–400)
RBC: 3.62 MIL/uL — ABNORMAL LOW (ref 3.87–5.11)
RDW: 14.2 % (ref 11.5–15.5)
WBC: 7.6 10*3/uL (ref 4.0–10.5)

## 2016-05-28 LAB — TSH: TSH: 2.178 u[IU]/mL (ref 0.350–4.500)

## 2016-05-28 LAB — GLUCOSE, CAPILLARY
GLUCOSE-CAPILLARY: 187 mg/dL — AB (ref 65–99)
GLUCOSE-CAPILLARY: 141 mg/dL — AB (ref 65–99)
GLUCOSE-CAPILLARY: 156 mg/dL — AB (ref 65–99)
Glucose-Capillary: 177 mg/dL — ABNORMAL HIGH (ref 65–99)
Glucose-Capillary: 209 mg/dL — ABNORMAL HIGH (ref 65–99)

## 2016-05-28 LAB — MRSA PCR SCREENING: MRSA by PCR: NEGATIVE

## 2016-05-28 MED ORDER — AMITRIPTYLINE HCL 10 MG PO TABS
10.0000 mg | ORAL_TABLET | Freq: Every day | ORAL | Status: DC
  Administered 2016-05-28 (×2): 10 mg via ORAL
  Filled 2016-05-28 (×2): qty 1

## 2016-05-28 MED ORDER — MAGNESIUM SULFATE 2 GM/50ML IV SOLN
2.0000 g | Freq: Once | INTRAVENOUS | Status: AC
  Administered 2016-05-28: 2 g via INTRAVENOUS
  Filled 2016-05-28: qty 50

## 2016-05-28 MED ORDER — APIXABAN 2.5 MG PO TABS
2.5000 mg | ORAL_TABLET | Freq: Two times a day (BID) | ORAL | Status: DC
  Administered 2016-05-28 – 2016-05-29 (×2): 2.5 mg via ORAL
  Filled 2016-05-28 (×2): qty 1

## 2016-05-28 MED ORDER — DILTIAZEM HCL ER COATED BEADS 120 MG PO CP24
120.0000 mg | ORAL_CAPSULE | Freq: Every day | ORAL | Status: DC
  Administered 2016-05-28 – 2016-05-29 (×2): 120 mg via ORAL
  Filled 2016-05-28 (×2): qty 1

## 2016-05-28 MED ORDER — DILTIAZEM HCL 30 MG PO TABS
30.0000 mg | ORAL_TABLET | Freq: Four times a day (QID) | ORAL | Status: DC
  Administered 2016-05-28: 30 mg via ORAL
  Filled 2016-05-28: qty 1

## 2016-05-28 MED ORDER — TECHNETIUM TC 99M DIETHYLENETRIAME-PENTAACETIC ACID: 32.0000 | Freq: Once | INTRAVENOUS | Status: DC | PRN

## 2016-05-28 MED ORDER — FUROSEMIDE 20 MG PO TABS
20.0000 mg | ORAL_TABLET | Freq: Every day | ORAL | Status: DC
  Administered 2016-05-28: 20 mg via ORAL
  Filled 2016-05-28: qty 1

## 2016-05-28 MED ORDER — METOLAZONE 5 MG PO TABS
2.5000 mg | ORAL_TABLET | Freq: Every day | ORAL | Status: DC
  Administered 2016-05-28: 2.5 mg via ORAL
  Filled 2016-05-28: qty 1

## 2016-05-28 MED ORDER — POTASSIUM CHLORIDE CRYS ER 20 MEQ PO TBCR
40.0000 meq | EXTENDED_RELEASE_TABLET | Freq: Two times a day (BID) | ORAL | Status: AC
  Administered 2016-05-28 (×3): 40 meq via ORAL
  Filled 2016-05-28 (×3): qty 2

## 2016-05-28 MED ORDER — DIAZEPAM 5 MG PO TABS
2.5000 mg | ORAL_TABLET | Freq: Every day | ORAL | Status: DC
  Administered 2016-05-28 (×2): 2.5 mg via ORAL
  Filled 2016-05-28 (×2): qty 1

## 2016-05-28 MED ORDER — SPIRONOLACTONE 25 MG PO TABS
25.0000 mg | ORAL_TABLET | Freq: Every day | ORAL | Status: DC
  Administered 2016-05-28 – 2016-05-29 (×2): 25 mg via ORAL
  Filled 2016-05-28 (×2): qty 1

## 2016-05-28 MED ORDER — DILTIAZEM HCL-DEXTROSE 100-5 MG/100ML-% IV SOLN (PREMIX): 5.0000 mg/h | INTRAVENOUS | Status: DC

## 2016-05-28 MED ORDER — FUROSEMIDE 40 MG PO TABS
40.0000 mg | ORAL_TABLET | Freq: Every day | ORAL | Status: DC
  Administered 2016-05-28 – 2016-05-29 (×2): 40 mg via ORAL
  Filled 2016-05-28 (×2): qty 1

## 2016-05-28 MED ORDER — LEVOTHYROXINE SODIUM 50 MCG PO TABS
50.0000 ug | ORAL_TABLET | Freq: Every day | ORAL | Status: DC
  Administered 2016-05-28 – 2016-05-29 (×2): 50 ug via ORAL
  Filled 2016-05-28 (×2): qty 1

## 2016-05-28 MED ORDER — SODIUM CHLORIDE 0.9 % IV SOLN
INTRAVENOUS | Status: DC
  Administered 2016-05-28: 02:00:00 via INTRAVENOUS

## 2016-05-28 MED ORDER — INSULIN ASPART 100 UNIT/ML ~~LOC~~ SOLN
0.0000 [IU] | Freq: Three times a day (TID) | SUBCUTANEOUS | Status: DC
  Administered 2016-05-28: 1 [IU] via SUBCUTANEOUS
  Administered 2016-05-28: 2 [IU] via SUBCUTANEOUS
  Administered 2016-05-28 – 2016-05-29 (×2): 3 [IU] via SUBCUTANEOUS
  Administered 2016-05-29: 2 [IU] via SUBCUTANEOUS

## 2016-05-28 MED ORDER — ACETAMINOPHEN 325 MG PO TABS: 650.0000 mg | ORAL_TABLET | Freq: Four times a day (QID) | ORAL | Status: DC | PRN

## 2016-05-28 MED ORDER — INSULIN ASPART 100 UNIT/ML ~~LOC~~ SOLN: 0.0000 [IU] | Freq: Every day | SUBCUTANEOUS | Status: DC

## 2016-05-28 MED ORDER — ACETAMINOPHEN 650 MG RE SUPP: 650.0000 mg | Freq: Four times a day (QID) | RECTAL | Status: DC | PRN

## 2016-05-28 MED ORDER — TECHNETIUM TO 99M ALBUMIN AGGREGATED
4.0000 | Freq: Once | INTRAVENOUS | Status: AC | PRN
  Administered 2016-05-28: 4 via INTRAVENOUS

## 2016-05-28 MED ORDER — TIOTROPIUM BROMIDE MONOHYDRATE 18 MCG IN CAPS
18.0000 ug | ORAL_CAPSULE | Freq: Every day | RESPIRATORY_TRACT | Status: DC
  Administered 2016-05-28 – 2016-05-29 (×2): 18 ug via RESPIRATORY_TRACT
  Filled 2016-05-28: qty 5
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
## 2016-05-28 NOTE — Progress Notes (Addendum)
[facility] for heparin --> Eliquis Indication: Afib  Allergies  Allergen Reactions  . Nicotine     Unknown  . Other Diarrhea    Nicotine Patch Steroids - cause shakiness    Labs:  Recent Labs  05/27/16 1944 05/28/16 0429 05/28/16 0915  HGB 12.5 10.6*  --   HCT 37.6 33.2*  --   PLT 340 281  --   HEPARINUNFRC  --  0.95* 0.90*  CREATININE 1.87* 1.68*  --   TROPONINI  --  <0.03  --     Estimated Creatinine Clearance: 29 mL/min (A) (by C-G formula based on SCr of 1.68 mg/dL (H)).     Assessment: Kristy Mcdonald is a 83 y.o. female who presents to the hospital with atrial fibrillation. Heparin level elevated this AM at 0.90 CBC stable  Goal of Therapy:  Heparin level 0.3-0.7 units/ml Monitor platelets by anticoagulation protocol: Yes   Plan:  Decrease heparin to 1000 units / hr 8 hour heparin level Could consider Eliquis at 2.5 mg po BID  -- transitioned to Eliquis this afternoon  Thank you Anette Guarneri, PharmD [phone]  05/28/2016,10:37 AM

## 2016-05-28 NOTE — Progress Notes (Signed)
Notified Provider that pt is now NSR. Continue gtt for now.

## 2016-05-28 NOTE — Care Management Note (Addendum)
Case Management Note  Patient Details  Name: Kristy Mcdonald MRN: [id number] Date of Birth: 10/12/1932  Subjective/Objective:   Pt presented for Atrial Fib RVR- Initiated on IV Cardizem gtt. Pt is from home with her husband and has support of daughter. Previously pt was active with [facility] for RN, PT,OT Services. Pt has DME 02 2L qhs with [facility] and she has a RW at home.                  Action/Plan: Pt was discussing that she gets short of breath with ambulation during the day. Pt will need to ambulate to see if she meets ambulatory qualifications for continuous 02. Staff RN is aware Brewing technologist aware. CM will continue to monitor.   Expected Discharge Date:                  Expected Discharge Plan:  [place]  In-House Referral:  NA  Discharge planning Services  CM Consult  Post Acute Care Choice:  Home Health, Resumption of Svcs/PTA Provider Choice offered to:  Patient, Adult Children  DME Arranged:   [facility] DME Agency:   [place]:   RN,PT,OT [place] Agency:  Other - See comment ([facility] [place])  Status of Service: Completed.  If discussed at [place] of Stay Meetings, dates discussed:    Additional Comments: 1609 05-29-16 Jacqlyn Krauss, RN,BSN [id number] CM did make referral for home 02 with [facility] in regards to tank delivery for home. Eliquis card provided to patient along with co pay. Med[facility] at [facility] [address].  No further needs from CM at this time.     1455 05-29-16 Jacqlyn Krauss, [place] [phone] S/W [name] @ [facility] RX # [phone] OPT- 2   APIXABAN ( ELIQUIS ) 2.50 MG BID   COVER- YES  CO-PAY- $ 47.00  TIER- 3 DRUG  PRIOR APPROVAL- NO   PREFERRED PHARMACY : [facility] Bethena Roys, RN 05/28/2016, 2:40 PM

## 2016-05-28 NOTE — Progress Notes (Signed)
ANTICOAGULATION CONSULT NOTE - Follow Up Consult  Pharmacy Consult for heparin Indication: r/o PE  Labs:  Recent Labs  05/27/16 1944 05/28/16 0429  HGB 12.5 10.6*  HCT 37.6 33.2*  PLT 340 281  HEPARINUNFRC  --  0.95*  CREATININE 1.87*  --      Assessment/Plan:  83yo female above goal on heparin though bolus was given late and lab drawn early so suspect this is more due to bolus than true SS level. Will continue gtt at current rate for now and check additional level.   Wynona Neat, PharmD, BCPS  05/28/2016,5:07 AM

## 2016-05-28 NOTE — Evaluation (Signed)
Physical Therapy Evaluation Patient Details Name: Kristy Mcdonald MRN: [id number] DOB: 02/01/1933 Today's Date: 05/28/2016   History of Present Illness  Pt adm with new onset afib with RVR. Pt also with recent fall with painful lt knee. PMH - copd, ckd, dm, macular degeneration  Clinical Impression  Pt presents to PT with significant limitations in mobility primarily due to lt knee pain after fall at home 8 days ago. Pt with supportive daughter and husband at home. Recommend ortho assessment of knee either as an inpatient or outpatient.     Follow Up Recommendations Home health PT;Supervision for mobility/OOB    Equipment Recommendations  None recommended by PT    Recommendations for Other Services       Precautions / Restrictions Precautions Precautions: Fall Restrictions Weight Bearing Restrictions: No      Mobility  Bed Mobility Overal bed mobility: Needs Assistance Bed Mobility: Supine to Sit;Sit to Supine     Supine to sit: Mod assist Sit to supine: Mod assist   General bed mobility comments: Assist to elevate trunk into sitting and bring hips to EOB. Assist to bring legs back up into bed  Transfers Overall transfer level: Needs assistance Equipment used: 4-wheeled walker Transfers: Sit to/from [facility] Sit to Stand: Mod assist;From elevated surface Stand pivot transfers: Min assist       General transfer comment: Heavy assist to bring hips up due to lt knee pain. Once up able to pivot bed to bsc with rollator with min A  Ambulation/Gait Ambulation/Gait assistance: Min assist Ambulation Distance (Feet): 25 Feet Assistive device: 4-wheeled walker Gait Pattern/deviations: Step-through pattern;Decreased step length - right;Decreased step length - left;Trunk flexed Gait velocity: decr Gait velocity interpretation: <1.8 ft/sec, indicative of risk for recurrent falls General Gait Details: Assist for safety and balance. Verbal cues to stand  more erect and stay closer to walker. Pt amb on RA with SpO2 90-92%. HR 80's with amb  Stairs            Wheelchair Mobility    Modified Rankin (Stroke Patients Only)       Balance Overall balance assessment: Needs assistance Sitting-balance support: No upper extremity supported;Feet supported Sitting balance-Leahy Scale: Good     Standing balance support: Bilateral upper extremity supported Standing balance-Leahy Scale: Poor Standing balance comment: rollator and min guard for static standing                             Pertinent Vitals/Pain Pain Assessment: Faces Faces Pain Scale: Hurts whole lot Pain Location: lt knee with sit to stand, resistance, and weight bearing Pain Descriptors / Indicators: Grimacing;Guarding;Crying Pain Intervention(s): Limited activity within patient's tolerance;Monitored during session;Repositioned    Home Living Family/patient expects to be discharged to:: Private residence Living Arrangements: Spouse/significant other;Children Available Help at Discharge: Family;Available 24 hours/day Type of Home: House Home Access: Stairs to enter Entrance Stairs-Rails: Right Entrance Stairs-Number of Steps: 4-5 (very short steps only 2-3 inches in height) Home Layout: One level Home Equipment: Toilet riser;Walker - 4 wheels      Prior Function Level of Independence: Independent with assistive device(s)         Comments: Prior to fall 8 days ago pt amb modified independent with rollator. Since fall has needed assist for transfers and gait     Hand Dominance        Extremity/Trunk Assessment   Upper Extremity Assessment Upper Extremity Assessment: Overall WFL for tasks  assessed    Lower Extremity Assessment Lower Extremity Assessment: Generalized weakness;LLE deficits/detail LLE Deficits / Details: active movement against gravity but limited by painful lt knee       Communication   Communication: No difficulties   Cognition Arousal/Alertness: Lethargic;Suspect due to medications (able to arouse to participate) Behavior During Therapy: [facility] for tasks assessed/performed Overall Cognitive Status: Within Functional Limits for tasks assessed                                        General Comments      Exercises     Assessment/Plan    PT Assessment Patient needs continued PT services  PT Problem List Decreased strength;Decreased activity tolerance;Decreased balance;Decreased mobility;Pain       PT Treatment Interventions DME instruction;Gait training;Functional mobility training;Therapeutic activities;Stair training;Therapeutic exercise;Balance training;Patient/family education    PT Goals (Current goals can be found in the Care Plan section)  Acute Rehab PT Goals Patient Stated Goal: improve knee pain PT Goal Formulation: With patient/family Time For Goal Achievement: 06/04/16 Potential to Achieve Goals: Good    Frequency Min 3X/week   Barriers to discharge Inaccessible home environment stairs to enter home    Co-evaluation               End of Session Equipment Utilized During Treatment: Gait belt Activity Tolerance: Patient limited by pain Patient left: in bed;with call bell/phone within reach;with family/visitor present Nurse Communication: Mobility status PT Visit Diagnosis: Difficulty in walking, not elsewhere classified (R26.2);Pain Pain - Right/Left: Left Pain - part of body: Knee    Time: 9371-6967 PT Time Calculation (min) (ACUTE ONLY): 49 min   Charges:   PT Evaluation $PT Eval Moderate Complexity: 1 Procedure PT Treatments $Gait Training: 23-37 mins   PT G Codes:   PT G-Codes **NOT FOR INPATIENT CLASS** Functional Assessment Tool Used: AM-PAC 6 Clicks Basic Mobility Functional Limitation: Mobility: Walking and moving around Mobility: Walking and Moving Around Current Status (E9381): At least 60 percent but less than 80 percent impaired,  limited or restricted Mobility: Walking and Moving Around Goal Status [phone]): At least 20 percent but less than 40 percent impaired, limited or restricted    [facility] PT [place] 05/28/2016, 10:45 AM

## 2016-05-28 NOTE — Progress Notes (Addendum)
PROGRESS NOTE    Kristy Mcdonald  QIH:[id number] DOB: [birth year] DOA: 05/27/2016 PCP: Elyn Peers, MD  Brief Narrative:Kristy Mcdonald is a 83 y.o. female with a past medical history significant for COPD, CKD baseline Cr 2.0, NIDDM, and hypothyroidism who presented with malaise. A month ago she was diagnosed with bronchitis and discharged with prednisone and a Zpak, after this continued to feel tired, dyspneic, vague malaise.  This progressed until over the last week she has had severe generalized weakness, malaise, fell a few times (once hurting her left knee), and had nausea and decreased appetite and palpitations. In ED noted to have Afib with HR 150s, mild hypoxia, K of 2.6, CXR clear  Assessment & Plan:   1. Atrial fibrillation with rapid ventricular rate:  New diagnosis CHADS2Vasc 4 (age, gender, DM).   -HR improved on diltiazem gtt and converted to NSR -start PO cardizem today -Continue heparin, start coumadin if ok with cards, poor candidate for NOACs due to CKD/baseline creatinine around 2 -Cardiology consulted, await input -FU echocardiogram -TSH unremarkable  2. Hypokalemia:  -due to diuretics on lasix and metolazone at home -Mag 1.6 will replace -given KCL yesterday, will keep on po KCL today -FU Bmet in am  3. Hyponatremia:  -mild, likely due to diuretics, has 1plus edema -resumed Po lasix at low dose  4. CKD stage IV:  Baseline Cr 2.0.  better than baseline now -monitor  5. Leg swelling, hypoxia:  -agree with VQ scan and dopplers to r/ VTE as trigger for AFib RVR, if negative, will challenge with IV diuretics and monitor  6. COPD:  -stable -Continue Spiriva  7. Hypothyroidism:  -Continue levothyroxine, TSH ok  8. Knee pain: -PT eval  9. Type 2 diabetes: -Hold orals -SSI with meals  DVT prophylaxis: on IV heparin now Code Status: FULL  Family Communication: Daughter at bedside  Disposition Plan: Cards pending  Consultants:    Subjective: Feels weak, uncomfortable  Objective: Vitals:   05/28/16 0325 05/28/16 0406 05/28/16 0825 05/28/16 0835  BP: 120/68 103/88 127/65   Pulse: 88 89 86   Resp: (!) 22 18 18    Temp:  98.4 F (36.9 C) 97.8 F (36.6 C)   TempSrc:  Oral Oral   SpO2: 97% 100% 95% 96%  Weight:  89.3 kg (196 lb 12.8 oz)    Height:  5\' 5"  (1.651 m)      Intake/Output Summary (Last 24 hours) at 05/28/16 1002 Last data filed at 05/28/16 0515  Gross per 24 hour  Intake          1255.21 ml  Output              100 ml  Net          1155.21 ml   Filed Weights   05/27/16 1941 05/28/16 0406  Weight: 89.8 kg (198 lb) 89.3 kg (196 lb 12.8 oz)    Examination:  General exam: elderly, obese female, laying in bed, no distress Respiratory system: poor air movement Cardiovascular system: S1 & S2 heard, RRR. No JVD, murmurs Gastrointestinal system: Abdomen is nondistended, soft and nontender.  Normal bowel sounds heard. Central nervous system: Alert and oriented. No focal neurological deficits. Extremities: Symmetric 5 x 5 power, 1 plus edema bilaterally R>L Skin: No rashes, lesions or ulcers Psychiatry: anxious    Data Reviewed:   CBC:  Recent Labs Lab 05/27/16 1944 05/28/16 0429  WBC 9.1 7.6  HGB 12.5 10.6*  HCT 37.6 33.2*  MCV 90.8  91.7  PLT 340 948   Basic Metabolic Panel:  Recent Labs Lab 05/27/16 1944 05/28/16 0429  NA 134* 138  K 2.7* 3.7  CL 92* 99*  CO2 28 30  GLUCOSE 223* 201*  BUN 25* 22*  CREATININE 1.87* 1.68*  CALCIUM 10.5* 9.4  MG  --  1.6*   GFR: Estimated Creatinine Clearance: 29 mL/min (A) (by C-G formula based on SCr of 1.68 mg/dL (H)). Liver Function Tests: No results for input(s): AST, ALT, ALKPHOS, BILITOT, PROT, ALBUMIN in the last 168 hours. No results for input(s): LIPASE, AMYLASE in the last 168 hours. No results for input(s): AMMONIA in the last 168 hours. Coagulation Profile: No results for input(s): INR, PROTIME in the last 168  hours. Cardiac Enzymes:  Recent Labs Lab 05/28/16 0429  TROPONINI <0.03   BNP (last 3 results) No results for input(s): PROBNP in the last 8760 hours. HbA1C: No results for input(s): HGBA1C in the last 72 hours. CBG:  Recent Labs Lab 05/28/16 0227 05/28/16 0746  GLUCAP 187* 177*   Lipid Profile: No results for input(s): CHOL, HDL, LDLCALC, TRIG, CHOLHDL, LDLDIRECT in the last 72 hours. Thyroid Function Tests:  Recent Labs  05/28/16 0429  TSH 2.178   Anemia Panel: No results for input(s): VITAMINB12, FOLATE, FERRITIN, TIBC, IRON, RETICCTPCT in the last 72 hours. Urine analysis: No results found for: COLORURINE, APPEARANCEUR, LABSPEC, PHURINE, GLUCOSEU, HGBUR, BILIRUBINUR, KETONESUR, PROTEINUR, UROBILINOGEN, NITRITE, LEUKOCYTESUR Sepsis Labs: @LABRCNTIP (procalcitonin:4,lacticidven:4)  ) Recent Results (from the past 240 hour(s))  MRSA PCR Screening     Status: None   Collection Time: 05/28/16  1:09 AM  Result Value Ref Range Status   MRSA by PCR NEGATIVE NEGATIVE Final    Comment:        The GeneXpert MRSA Assay (FDA approved for NASAL specimens only), is one component of a comprehensive MRSA colonization surveillance program. It is not intended to diagnose MRSA infection nor to guide or monitor treatment for MRSA infections.          Radiology Studies: Dg Chest Portable 1 View  Result Date: 05/27/2016 CLINICAL DATA:  Tachycardia, feeling unwell. In atrial fibrillation. Recent falls. EXAM: PORTABLE CHEST 1 VIEW COMPARISON:  Chest radiograph April 25, 2016 FINDINGS: Cardiac silhouette is mildly enlarged. Mediastinal silhouette is nonsuspicious. Mildly calcified aortic knob. No pleural effusion or focal consolidation, improved aeration from prior imaging. No pneumothorax. Osteopenia. Soft tissue planes are nonsuspicious. IMPRESSION: Mild cardiomegaly.  No acute pulmonary process. Electronically Signed   By: Elon Alas M.D.   On: 05/27/2016 20:18         Scheduled Meds: . amitriptyline  10 mg Oral QHS  . diazepam  2.5 mg Oral QHS  . furosemide  20 mg Oral Daily  . insulin aspart  0-5 Units Subcutaneous QHS  . insulin aspart  0-9 Units Subcutaneous TID WC  . levothyroxine  50 mcg Oral QAC breakfast  . metolazone  2.5 mg Oral Daily  . potassium chloride  40 mEq Oral BID  . tiotropium  18 mcg Inhalation Daily   Continuous Infusions: . diltiazem (CARDIZEM) infusion    . heparin 1,250 Units/hr (05/27/16 2311)     LOS: 0 days    Time spent: 31min    Domenic Polite, MD [facility] Pager [phone]  If 7PM-7AM, please contact night-coverage [email or web address] Password [email or web address] 05/28/2016, 10:02 AM

## 2016-05-28 NOTE — Progress Notes (Signed)
*  PRELIMINARY RESULTS* Vascular Ultrasound Right lower extremity venous duplex has been completed.  Preliminary findings: No evidence of deep vein thrombosis or baker's cysts in the right lower extremity. Limited visualization of calf veins due to pitting edema.   Everrett Coombe 05/28/2016, 10:51 AM

## 2016-05-28 NOTE — Plan of Care (Signed)
Problem: Safety: Goal: Ability to remain free from injury will improve Outcome: Progressing Pt is high fall risk. Had fall at home. Fall precautions maintained. Pt still very week. Unable to stand at bedside. Appropriate assistive devices used. PT consulted. Educated pt and family about fall and safety risk. Daughter at bedside.  Problem: Tissue Perfusion: Goal: Risk factors for ineffective tissue perfusion will decrease Outcome: Progressing Pt came to floor w/ A-fib. Now converted to NSR. Provider made aware. Monitoring on heparin and cardizem gtt. Other VSS. Denies any CP or SOB.  Problem: Activity: Goal: Risk for activity intolerance will decrease Outcome: Progressing Pt requires assistance. Unable to pull self to standing postion. Weakness in legs r/t swelling. Educated pt on safety. Legs elevated. Repositioned pt in bed. Continue to monitor.  Problem: Fluid Volume: Goal: Ability to maintain a balanced intake and output will improve Outcome: Progressing Pt had low output this shift. Labs being monitored. IV fluids and replacements running as ordered. Continue to monitor.  Problem: Nutrition: Goal: Adequate nutrition will be maintained Outcome: Progressing Pt has poor appetite at home. Slightly better now. Continue to monitor and encourage intake.

## 2016-05-28 NOTE — Care Management Obs Status (Signed)
[facility] NOTIFICATION   Patient Details  Name: LUCCA GREGGS MRN: [id number] Date of Birth: 02/08/1932   Medicare Observation Status Notification Given:  Yes    Bethena Roys, RN 05/28/2016, 2:27 PM

## 2016-05-29 ENCOUNTER — Observation Stay (HOSPITAL_BASED_OUTPATIENT_CLINIC_OR_DEPARTMENT_OTHER): Payer: Medicare Other

## 2016-05-29 ENCOUNTER — Other Ambulatory Visit: Payer: Self-pay | Admitting: Emergency Medicine

## 2016-05-29 DIAGNOSIS — I4891 Unspecified atrial fibrillation: Secondary | ICD-10-CM | POA: Diagnosis not present

## 2016-05-29 DIAGNOSIS — E039 Hypothyroidism, unspecified: Secondary | ICD-10-CM | POA: Diagnosis not present

## 2016-05-29 DIAGNOSIS — I509 Heart failure, unspecified: Secondary | ICD-10-CM | POA: Diagnosis not present

## 2016-05-29 DIAGNOSIS — J449 Chronic obstructive pulmonary disease, unspecified: Secondary | ICD-10-CM | POA: Diagnosis not present

## 2016-05-29 DIAGNOSIS — I480 Paroxysmal atrial fibrillation: Secondary | ICD-10-CM | POA: Diagnosis not present

## 2016-05-29 DIAGNOSIS — E876 Hypokalemia: Secondary | ICD-10-CM

## 2016-05-29 DIAGNOSIS — N184 Chronic kidney disease, stage 4 (severe): Secondary | ICD-10-CM | POA: Diagnosis not present

## 2016-05-29 LAB — BASIC METABOLIC PANEL
Anion gap: 9 (ref 5–15)
BUN: 17 mg/dL (ref 6–20)
CO2: 29 mmol/L (ref 22–32)
CREATININE: 1.78 mg/dL — AB (ref 0.44–1.00)
Calcium: 9.4 mg/dL (ref 8.9–10.3)
Chloride: 102 mmol/L (ref 101–111)
GFR calc Af Amer: 30 mL/min — ABNORMAL LOW (ref >60)
GFR, EST NON AFRICAN AMERICAN: 26 mL/min — AB (ref >60)
Glucose, Bld: 151 mg/dL — ABNORMAL HIGH (ref 65–99)
Potassium: 3.7 mmol/L (ref 3.5–5.1)
SODIUM: 140 mmol/L (ref 135–145)

## 2016-05-29 LAB — GLUCOSE, CAPILLARY
GLUCOSE-CAPILLARY: 156 mg/dL — AB (ref 65–99)
Glucose-Capillary: 216 mg/dL — ABNORMAL HIGH (ref 65–99)

## 2016-05-29 LAB — ECHOCARDIOGRAM COMPLETE
HEIGHTINCHES: 65 in
WEIGHTICAEL: 3113.6 [oz_av]

## 2016-05-29 MED ORDER — FUROSEMIDE 40 MG PO TABS: 40.0000 mg | ORAL_TABLET | Freq: Two times a day (BID) | ORAL | 0 refills | Status: DC

## 2016-05-29 MED ORDER — SPIRONOLACTONE 25 MG PO TABS: 25.0000 mg | ORAL_TABLET | Freq: Every day | ORAL | 0 refills | Status: AC

## 2016-05-29 MED ORDER — GLIMEPIRIDE 4 MG PO TABS: 8.0000 mg | ORAL_TABLET | Freq: Every day | ORAL | 0 refills | Status: AC

## 2016-05-29 MED ORDER — DILTIAZEM HCL ER COATED BEADS 120 MG PO CP24: 120.0000 mg | ORAL_CAPSULE | Freq: Every day | ORAL | 0 refills | Status: AC

## 2016-05-29 MED ORDER — APIXABAN 2.5 MG PO TABS: 2.5000 mg | ORAL_TABLET | Freq: Two times a day (BID) | ORAL | 0 refills | Status: AC

## 2016-05-29 NOTE — Progress Notes (Signed)
SATURATION QUALIFICATIONS: (This note is used to comply with regulatory documentation for home oxygen)  Patient Saturations on Room Air at Rest = 92%  Patient Saturations on Room Air while Ambulating = 85%  Patient Saturations on 2 Liters of oxygen while Ambulating = 96%  Please briefly explain why patient needs home oxygen: Pt unable to maintain adequate oxygenation with activity without supplemental O2.  [employer] PT [phone]

## 2016-05-29 NOTE — Plan of Care (Signed)
Problem: Safety: Goal: Ability to remain free from injury will improve Outcome: Progressing Educated on fall and bleeding risk. Pt remained free from injury.  Problem: Pain Managment: Goal: General experience of comfort will improve Outcome: Progressing Denies Pain  Problem: Tissue Perfusion: Goal: Risk factors for ineffective tissue perfusion will decrease Outcome: Progressing VSS throughout shift. Denies CP, Dizziness, or SOB. Currently NSR. Educated on Elequis  Problem: Activity: Goal: Risk for activity intolerance will decrease Outcome: Progressing Dangled at bedside. Still some limited movement in LE but better bed mobility. Denies pain.
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
## 2016-05-29 NOTE — Progress Notes (Signed)
Physical Therapy Treatment Patient Details Name: Kristy Mcdonald MRN: [id number] DOB: 1932-07-17 Today's Date: 05/29/2016    History of Present Illness Pt adm with new onset afib with RVR. Pt also with recent fall with painful lt knee. PMH - copd, ckd, dm, macular degeneration    PT Comments    Pt with improving lt knee pain and mobility. Pt eager to go home. Daughter and husband to assist as needed at home.    Follow Up Recommendations  Home health PT;Supervision for mobility/OOB     Equipment Recommendations  None recommended by PT    Recommendations for Other Services       Precautions / Restrictions Precautions Precautions: Fall Restrictions Weight Bearing Restrictions: No    Mobility  Bed Mobility Overal bed mobility: Modified Independent Bed Mobility: Supine to Sit     Supine to sit: Modified independent (Device/Increase time);HOB elevated     General bed mobility comments: Incr time but no assist  Transfers Overall transfer level: Needs assistance Equipment used: 4-wheeled walker Transfers: Sit to/from Stand Sit to Stand: Min assist         General transfer comment: assist to bring hips up  Ambulation/Gait Ambulation/Gait assistance: Supervision Ambulation Distance (Feet): 70 Feet Assistive device: 4-wheeled walker Gait Pattern/deviations: Step-through pattern;Trunk flexed;Decreased stride length Gait velocity: decr Gait velocity interpretation: Below normal speed for age/gender General Gait Details: Assist for safety. SpO2 dropped to 85% on RA. Replaced O2 at 2L with SpO2 up to 96%   Stairs            Wheelchair Mobility    Modified Rankin (Stroke Patients Only)       Balance Overall balance assessment: Needs assistance Sitting-balance support: No upper extremity supported;Feet supported Sitting balance-Leahy Scale: Good     Standing balance support: No upper extremity supported;During functional activity Standing  balance-Leahy Scale: Fair                              Cognition Arousal/Alertness: Awake/alert Behavior During Therapy: WFL for tasks assessed/performed Overall Cognitive Status: Within Functional Limits for tasks assessed                                        Exercises      General Comments        Pertinent Vitals/Pain Pain Assessment: 0-10 Pain Score: 4  Pain Location: lt knee with sit to stand Pain Descriptors / Indicators: Grimacing;Guarding Pain Intervention(s): Limited activity within patient's tolerance    Home Living                      Prior Function            PT Goals (current goals can now be found in the care plan section) Progress towards PT goals: Progressing toward goals    Frequency    Min 3X/week      PT Plan Current plan remains appropriate    Co-evaluation             End of Session Equipment Utilized During Treatment: Gait belt;Oxygen Activity Tolerance: Patient tolerated treatment well Patient left: in bed;with call bell/phone within reach;with bed alarm set (sitting EOB) Nurse Communication: Mobility status PT Visit Diagnosis: Difficulty in walking, not elsewhere classified (R26.2);Pain Pain - Right/Left: Left Pain - part of body: Knee  Time: 9432-7614 PT Time Calculation (min) (ACUTE ONLY): 22 min  Charges:  $Gait Training: 8-22 mins                    G Codes:       [facility] PT [place] 05/29/2016, 10:46 AM

## 2016-05-29 NOTE — Progress Notes (Signed)
SATURATION QUALIFICATIONS: (This note is used to comply with regulatory documentation for home oxygen)  Patient Saturations on Room Air at Rest = 92%  Patient Saturations on Room Air while Ambulating = 83%  Patient Saturations on 2 Liters of oxygen while Ambulating = 96%  Please briefly explain why patient needs home oxygen:

## 2016-05-29 NOTE — Progress Notes (Signed)
[facility] dept called regarding pt for pending discharge.

## 2016-05-29 NOTE — Progress Notes (Addendum)
Progress Note  Patient Name: Kristy Mcdonald Date of Encounter: 05/29/2016  Primary Cardiologist: Dr. Meda Coffee (new)  Subjective   Echocardiogram pending for today. VQ scan revealed low probability for PE. Case Manager is involved to assess for home oxygen requirement. Yesterday she was started on Eliquis and her Metolazone was held and Spironolactone started instead. Potassium is now normal and magnesium was replaced.   Maintained sinus rhythm over night but O2 frequently desats even at rest.  Inpatient Medications    Scheduled Meds: . amitriptyline  10 mg Oral QHS  . apixaban  2.5 mg Oral BID  . diazepam  2.5 mg Oral QHS  . diltiazem  120 mg Oral Daily  . furosemide  40 mg Oral Daily  . insulin aspart  0-5 Units Subcutaneous QHS  . insulin aspart  0-9 Units Subcutaneous TID WC  . levothyroxine  50 mcg Oral QAC breakfast  . spironolactone  25 mg Oral Daily  . tiotropium  18 mcg Inhalation Daily   Continuous Infusions:  PRN Meds: acetaminophen **OR** acetaminophen, technetium TC 82M diethylenetriame-pentaacetic acid   Vital Signs    Vitals:   05/28/16 2100 05/29/16 0300 05/29/16 0505 05/29/16 0527  BP:  (!) 124/58 125/62   Pulse:  84 93   Resp:   (!) 26   Temp: 98.6 F (37 C)   98 F (36.7 C)  TempSrc: Oral   Oral  SpO2:  95% (!) 87%   Weight:    194 lb 9.6 oz (88.3 kg)  Height:        Intake/Output Summary (Last 24 hours) at 05/29/16 0757 Last data filed at 05/29/16 0528  Gross per 24 hour  Intake              460 ml  Output             2300 ml  Net            -1840 ml   Filed Weights   05/27/16 1941 05/28/16 0406 05/29/16 0527  Weight: 198 lb (89.8 kg) 196 lb 12.8 oz (89.3 kg) 194 lb 9.6 oz (88.3 kg)    Telemetry    NSR, HR 96 - Personally Reviewed  ECG    Atrial Fibrillation with RVR (4/23) - Personally Reviewed  Physical Exam   GEN: No acute distress.   Neck: No JVD Cardiac: RRR, no murmurs, rubs, or gallops.  Respiratory: Clear to  auscultation bilaterally. GI: Soft, nontender, non-distended  MS: No edema; No deformity. Neuro:  Nonfocal  Psych: Normal affect   Labs    Chemistry Recent Labs Lab 05/27/16 1944 05/28/16 0429  NA 134* 138  K 2.7* 3.7  CL 92* 99*  CO2 28 30  GLUCOSE 223* 201*  BUN 25* 22*  CREATININE 1.87* 1.68*  CALCIUM 10.5* 9.4  GFRNONAA 24* 27*  GFRAA 28* 32*  ANIONGAP 14 9     Hematology Recent Labs Lab 05/27/16 1944 05/28/16 0429  WBC 9.1 7.6  RBC 4.14 3.62*  HGB 12.5 10.6*  HCT 37.6 33.2*  MCV 90.8 91.7  MCH 30.2 29.3  MCHC 33.2 31.9  RDW 14.0 14.2  PLT 340 281    Cardiac Enzymes Recent Labs Lab 05/28/16 0429  TROPONINI <0.03    Recent Labs Lab 05/27/16 2022  TROPIPOC 0.02     BNP Recent Labs Lab 05/28/16 0500  BNP 129.2*     Radiology    Nm Pulmonary Perf And Vent  Result Date: 05/28/2016 CLINICAL DATA:  Dyspnea.  Ongoing cough.  COPD. EXAM: NUCLEAR MEDICINE VENTILATION - PERFUSION LUNG SCAN TECHNIQUE: Ventilation images were obtained in multiple projections using inhaled aerosol Tc-62m DTPA. Perfusion images were obtained in multiple projections after intravenous injection of Tc-63m MAA. RADIOPHARMACEUTICALS:  Thirty-two mCi Technetium-49m DTPA aerosol inhalation and 4.3 mCi Technetium-85m MAA IV COMPARISON:  Chest x-ray from the same day. FINDINGS: Ventilation: A subsegmental defect is present over the posterolateral right lower lobe. No other focal defects are present. Perfusion: There is a matched defect in the posterolateral right lower lobe. This may reflect an overlying defect. No discrete mass lesion or airspace disease is evident on the chest x-ray. No other focal filling defects are present. IMPRESSION: 1. Matched subsegmental ventilation and perfusion defect with a normal chest radiograph suggesting low probability for pulmonary embolus. Electronically Signed   By: San Morelle M.D.   On: 05/28/2016 16:51   Dg Chest Portable 1  View  Result Date: 05/27/2016 CLINICAL DATA:  Tachycardia, feeling unwell. In atrial fibrillation. Recent falls. EXAM: PORTABLE CHEST 1 VIEW COMPARISON:  Chest radiograph April 25, 2016 FINDINGS: Cardiac silhouette is mildly enlarged. Mediastinal silhouette is nonsuspicious. Mildly calcified aortic knob. No pleural effusion or focal consolidation, improved aeration from prior imaging. No pneumothorax. Osteopenia. Soft tissue planes are nonsuspicious. IMPRESSION: Mild cardiomegaly.  No acute pulmonary process. Electronically Signed   By: Elon Alas M.D.   On: 05/27/2016 20:18    Cardiac Studies   Echocardiogram is pending.  Patient Profile     Kristy Mcdonald is a 83 y.o. F has history of CKD stage IV (baseline Cr 2), COPD, ongoing tobacco abuse, NIDDM, hypothyroidism, chronic LE redness. She presented to the ED on 4/23 for symptoms of palpitations, le swelling and weakness. Her EKG was suspicious for atrial fib RVR 158bpm. She was treated with heparin and diltiazem then started on infusion. We do not have any other tracings or telemetry available but since being on the floor she has maintained NSR.  Assessment & Plan    1. Newly recognized atrial fib RVR - Back in sinus rhythm. Started on cardizem CD 120 mg po daily yesterday and maintaining NSR.  Frequently hypokalemic therefore Spironolactone added to her regimen of lasix yesterday and she was started on Eliquis 2.5 mg daily dosed per pharmacy for [place] 4.  -- net loss 684 mL's since admission, 4 lb weight loss. -- potassium 3.7, TSH wnl, Cr 1.68 -- Magnesium 1.6, low -  Two x 2g Magnesium given yesterday  2. COPD with ongoing tobacco abuse - Case Management involved to assess for home O2 requirement. Melina Copa, PA-C,discussed smoking sensation with patient yesterday who voiced she has recently cut back in smoking but does note have the desire to quite as it has made her sick in the past. -- VQ scan resulted low probability  for PE, pt started on Eliquis for new onset afib.  3. Lower extremity edema and redness  -- BNP 129  -- Echocardiogram pending for today -- on IV abx for possible cellulitis --  Metolazone being held for hypokalemia, started on Spironolactone and lasix continued. Will need echocardiogram to better understand the nature of her le edema cellulitis vs CHF.   4. CKD IV - Cr appears at baseline.  (1.68)  5. Severe hypokalemia and mild hypomagnesemia -  -- hypokalemia has resolved, metolazone being held, started on Spironolactone.  -- Magnesium replaced yesterday 2 g IV x 2.  Patient really wanting to go home. From a cardiology standpoint  if echocardiogram is not acutely abnormal and she receives home oxygen she is okay to discharge from cardiology standpoint BUT [place]. Would recommend holding Metolazone at discharge and cont Spironolactone. Continue Eliquis 2.5 and Cardizem. Will need recheck BMP in 1 week.  Kristopher Glee, PA-C  05/29/2016, 7:57 AM    The patient was seen, examined and discussed with GREENE,TIFFANY G, PA-C and I agree with the above.   31F with CKD stage III (baseline Cr 2 which the patient was unaware of), COPD, longstanding ongoing tobacco abuse, NIDDM, hypothyroidism, chronic LE redness who presented to [facility] at the advice of her PCP due to rapid HR, found to have AF RVR. Had recent OP tx with Rocephin for cellulitis, also recent weakness, falls, malaise. She was started on Heparin drip and cardizem drip and cardioverted to SR in few hours, remains in SR this am. Based on history it appears that she might have been in a-fib for several days (states that she had heart racing with walking). Continue cardizem CD 120 mg po daily. Started on Eliquis 2.5 mg po BID. She has been on metolazone and lasix for chronic LE edema, she has been frequently hypokalemia. We added spironolactone 25 mg po daily to her regimen. We will increase lasix to 40 mg  po BID as she continues to have LE edema and insist on going home. We don't have any prior LVEF assessment. Prior to the discharge she will need an echocardiogram and home O2 supplies. We will arrange for a 7-10 day follow up in our clinic with repeat BMP and BNP.    Ena Dawley, MD 05/29/2016

## 2016-05-29 NOTE — Progress Notes (Signed)
  Echocardiogram 2D Echocardiogram has been performed.  Kristy Mcdonald 05/29/2016, 3:05 PM

## 2016-05-30 DIAGNOSIS — J449 Chronic obstructive pulmonary disease, unspecified: Secondary | ICD-10-CM | POA: Diagnosis not present

## 2016-05-30 DIAGNOSIS — E119 Type 2 diabetes mellitus without complications: Secondary | ICD-10-CM | POA: Diagnosis not present

## 2016-05-30 DIAGNOSIS — I10 Essential (primary) hypertension: Secondary | ICD-10-CM | POA: Diagnosis not present

## 2016-05-30 DIAGNOSIS — E785 Hyperlipidemia, unspecified: Secondary | ICD-10-CM | POA: Diagnosis not present

## 2016-05-30 DIAGNOSIS — L03115 Cellulitis of right lower limb: Secondary | ICD-10-CM | POA: Diagnosis not present

## 2016-05-30 DIAGNOSIS — M6281 Muscle weakness (generalized): Secondary | ICD-10-CM | POA: Diagnosis not present

## 2016-05-31 ENCOUNTER — Encounter: Payer: Self-pay | Admitting: Physician Assistant

## 2016-05-31 DIAGNOSIS — I10 Essential (primary) hypertension: Secondary | ICD-10-CM | POA: Diagnosis not present

## 2016-05-31 DIAGNOSIS — E785 Hyperlipidemia, unspecified: Secondary | ICD-10-CM | POA: Diagnosis not present

## 2016-05-31 DIAGNOSIS — E119 Type 2 diabetes mellitus without complications: Secondary | ICD-10-CM | POA: Diagnosis not present

## 2016-05-31 DIAGNOSIS — M6281 Muscle weakness (generalized): Secondary | ICD-10-CM | POA: Diagnosis not present

## 2016-05-31 DIAGNOSIS — J449 Chronic obstructive pulmonary disease, unspecified: Secondary | ICD-10-CM | POA: Diagnosis not present

## 2016-05-31 DIAGNOSIS — L03115 Cellulitis of right lower limb: Secondary | ICD-10-CM | POA: Diagnosis not present

## 2016-06-03 ENCOUNTER — Encounter: Payer: Self-pay | Admitting: Physician Assistant

## 2016-06-03 DIAGNOSIS — R600 Localized edema: Secondary | ICD-10-CM | POA: Insufficient documentation

## 2016-06-03 NOTE — Progress Notes (Signed)
Pt has appt with me tomorrow. I plan to draw CBC with labs in addition to what was recommended (BNP, BMET).  In order to prevent office from directing patient to the lab first inadvertantly without CBC order, have asked nurse to cancel the standing BMET/BNP and I will plan to re-order once in the office encounter so she doesn't get stuck twice. Dayna Dunn PA-C

## 2016-06-03 NOTE — Progress Notes (Signed)
Cardiology Office Note    Date:  06/04/2016  ID:  Kristy Mcdonald, DOB 1932/03/09, MRN [id number] PCP:  Kristy Peers, MD  Cardiologist:  Dr. Meda Mcdonald   Chief Complaint: f/u edema and atrial fib  History of Present Illness:  Kristy Mcdonald is a 83 y.o. female with history of CKD stage IV (baseline Cr 2), COPD, ongoing tobacco abuse, NIDDM, hypothyroidism, chronic LE redness/edema historically on metolazone by primary care and recently diagnosed PAF who presents for post-hospital follow-up. She was recently admitted 05/27/16 with palpitations, LE swelling and weakness. She had been treated for cellulitis as OP with home rocephin. Her EKG on arrival was suspicious for atrial fib RVR 158bpm and she converted to NSR/WAP on diltiazem. Based on history it appeared that she might have been in a-fib for several days (states that she had heart racing with walking). She was started on Eliquis. VQ was low probability for PE but she was found to require home O2 upon ambulation at discharge. She was not interested in quitting smoking completely but had cut down to 1/3 of what she used to smoke. Metolazone was stopped due to severel hypokalemia and mild hypermagnesemia. Her Lasix was titrated to 40mg  BID and spironolactone was added. 2D echo 05/29/16 showed EF 50%, diffuse HK, respirophasic variation of the interventricular septum was noted, may be related to significant COPD, RV mildly dilated but systolic function normal, dilated IVC suggesting elevated RV filling pressure. Discharge labs 4/25 showed K 3.7, Cr 1.78; other pertinent labs include BNP 129, TSH wnl, troponin wnl, Hgb 10.6.  She returns for follow-up today. She states overall she is doing well. Her edema is the best she's seen it recently. Still present but much less than recent hospital stay. Her major complaint is chronic knee pain for which she's doing PT. No recurrent falls. Sh's here in a wheelchair today. She reports much less palpitations with  exertion. She has chronic DOE. She's not noticed any bleeding on the apixaban. She also complains of constipation and plans on discussing this further with her PCP at their appointment later today.     Past Medical History:  Diagnosis Date  . Chronic respiratory failure ([place])   . CKD (chronic kidney disease), stage III   . COPD (chronic obstructive pulmonary disease) ([place])   . Diabetes mellitus ([place])   . Hypertension   . Hypokalemia   . Hypothyroidism   . Lower extremity edema   . PAF (paroxysmal atrial fibrillation) ([place])    a. dx 05/2016.  . Tobacco abuse     No past surgical history on file.  Current Medications: Current Outpatient Prescriptions  Medication Sig Dispense Refill  . albuterol (PROVENTIL HFA;VENTOLIN HFA) 108 (90 BASE) MCG/ACT inhaler Inhale 2 puffs into the lungs every 6 (six) hours as needed for wheezing or shortness of breath.    Marland Kitchen amitriptyline (ELAVIL) 10 MG tablet Take 10 mg by mouth at bedtime.    Marland Kitchen apixaban (ELIQUIS) 2.5 MG TABS tablet Take 1 tablet (2.5 mg total) by mouth 2 (two) times daily. 60 tablet 0  . diazepam (VALIUM) 5 MG tablet Take 2.5 mg by mouth at bedtime. Take every night per patient    . diltiazem (CARDIZEM CD) 120 MG 24 hr capsule Take 1 capsule (120 mg total) by mouth daily. 30 capsule 0  . furosemide (LASIX) 40 MG tablet Take 1 tablet (40 mg total) by mouth 2 (two) times daily. 60 tablet 0  . glimepiride (AMARYL) 4 MG tablet Take  2 tablets (8 mg total) by mouth daily with breakfast. 60 tablet 0  . levothyroxine (SYNTHROID, LEVOTHROID) 50 MCG tablet Take 50 mcg by mouth daily before breakfast.    . spironolactone (ALDACTONE) 25 MG tablet Take 1 tablet (25 mg total) by mouth daily. 30 tablet 0  . tiotropium (SPIRIVA HANDIHALER) 18 MCG inhalation capsule Place 18 mcg into inhaler and inhale daily.     No current facility-administered medications for this visit.      Allergies:   Nicotine and Other   Social History   Social History  .  Marital status: Married    Spouse name: N/A  . Number of children: N/A  . Years of education: N/A   Social History Main Topics  . Smoking status: Current Every Day Smoker  . Smokeless tobacco: Never Used  . Alcohol use No     Comment: Cut Back 2/3 %  . Drug use: No  . Sexual activity: Not Asked   Other Topics Concern  . None   Social History Narrative  . None     Family History:  Family History  Problem Relation Age of Onset  . CAD Father   . Hypertension Father   . Hypertension Sister   . Dementia Mother     ROS:   Please see the history of present illness.  All other systems are reviewed and otherwise negative.    PHYSICAL EXAM:   VS:  BP 130/76   Pulse 76   Ht 5\' 5"  (1.651 m)   SpO2 92% Comment: 2L @  Bed Time  BMI: There is no height or weight on file to calculate BMI. GEN: Well nourished, well developed elderly WF, in no acute distress  HEENT: normocephalic, atraumatic, chronically low pitched hoarse voice Neck: no JVD, carotid bruits, or masses Cardiac: irregular, rate controlled, no murmurs, rubs, or gallops, trace BLE pedal edema  Respiratory:  diffuse occasional expiratory wheeze,normal work of breathing GI: soft, nontender, nondistended, + BS MS: no deformity or atrophy  Skin: warm and dry, bilateral lower extremity erythema which patient states is chronic Neuro:  Alert and Oriented x 3, Strength and sensation are intact, follows commands Psych: euthymic mood, full affect  Wt Readings from Last 3 Encounters:  06/04/16 192 lb (87.1 kg)  05/29/16 194 lb 9.6 oz (88.3 kg)  04/25/16 198 lb (89.8 kg)      Studies/Labs Reviewed:   EKG:  EKG was ordered today and personally reviewed by me and demonstrates sinus tach versus MAT 112bpm no acute ST-T changes.  Recent Labs: 05/28/2016: B Natriuretic Peptide 129.2; Hemoglobin 10.6; Magnesium 1.6; Platelets 281; TSH 2.178 05/29/2016: BUN 17; Creatinine, Ser 1.78; Potassium 3.7; Sodium 140   Lipid Panel No  results found for: CHOL, TRIG, HDL, CHOLHDL, VLDL, LDLCALC, LDLDIRECT  Additional studies/ records that were reviewed today include: Summarized above.    ASSESSMENT & PLAN:   1. Paroxysmal atrial fibrillation - EKG today shows what appears to be WAP/MAT which is what telemetry showed after she converted from atrial fib in the hospital. Today, her HR was 112 by EKG after trying to stand for her weights, but at rest it was 76-82. I suspect the elevated HR with exertion is physiologically compensating for her severe underlying COPD. She states her baseline HR typically runs higher. We discussed possibly increasing her diltiazem for improved rate control but the patient is satisfied with how she is feeling lately and wishes to remain at the same dose. She will notify our  office if she begins to notice more dyspnea or palpitations with exertion. She also has had issues with constipation recently which may be a side effect of the diltiazem - she plans to discuss later today with her PCP who has treated this successfully before. I suggested that a stool softener like Colace may be helpful to prevent future episodes. 2. Lower extremity edema/suspected chronic right-sided CHF related to COPD - she has chronic edema which she feels is better today than recent hospital stay. Weight is down another 2lb from discharge. Continue current diuretics and plan to recheck BMET and BNP today per Dr. Francesca Oman recommendations. 3. Hypokalemia - f/u BMET today. 4. CKD stage IV - f/u BMET today. Long-term follow-up of this per primary care. 5. Anemia, normocytic - needs recheck since Hgb was slightly lower in hospital. Check CBC today.  Disposition: F/u with Dr. Meda Mcdonald in 3 months.   Medication Adjustments/Labs and Tests Ordered: Current medicines are reviewed at length with the patient today.  Concerns regarding medicines are outlined above. Medication changes, Labs and Tests ordered today are summarized above and listed  in the Patient Instructions accessible in Encounters.   Kristy Ache PA-C  06/04/2016 9:50 AM    [address], [place], [place]  [address] Phone: [phone]; Fax: [phone]

## 2016-06-04 ENCOUNTER — Encounter: Payer: Self-pay | Admitting: Physician Assistant

## 2016-06-04 ENCOUNTER — Ambulatory Visit (INDEPENDENT_AMBULATORY_CARE_PROVIDER_SITE_OTHER): Payer: Medicare Other | Admitting: Physician Assistant

## 2016-06-04 VITALS — BP 130/76 | HR 76 | Ht 65.0 in | Wt 192.0 lb

## 2016-06-04 DIAGNOSIS — E876 Hypokalemia: Secondary | ICD-10-CM | POA: Diagnosis not present

## 2016-06-04 DIAGNOSIS — N184 Chronic kidney disease, stage 4 (severe): Secondary | ICD-10-CM | POA: Diagnosis not present

## 2016-06-04 DIAGNOSIS — D649 Anemia, unspecified: Secondary | ICD-10-CM

## 2016-06-04 DIAGNOSIS — Z79899 Other long term (current) drug therapy: Secondary | ICD-10-CM

## 2016-06-04 DIAGNOSIS — R600 Localized edema: Secondary | ICD-10-CM | POA: Diagnosis not present

## 2016-06-04 DIAGNOSIS — I4891 Unspecified atrial fibrillation: Secondary | ICD-10-CM | POA: Diagnosis not present

## 2016-06-04 DIAGNOSIS — I480 Paroxysmal atrial fibrillation: Secondary | ICD-10-CM | POA: Diagnosis not present

## 2016-06-04 DIAGNOSIS — I50812 Chronic right heart failure: Secondary | ICD-10-CM | POA: Diagnosis not present

## 2016-06-04 LAB — CBC WITH DIFFERENTIAL/PLATELET
BASOS ABS: 0 10*3/uL (ref 0.0–0.2)
BASOS: 0 %
EOS (ABSOLUTE): 0 10*3/uL (ref 0.0–0.4)
Eos: 0 %
HEMOGLOBIN: 12 g/dL (ref 11.1–15.9)
Hematocrit: 36 % (ref 34.0–46.6)
IMMATURE GRANS (ABS): 0 10*3/uL (ref 0.0–0.1)
IMMATURE GRANULOCYTES: 0 %
LYMPHS: 16 %
Lymphocytes Absolute: 1.6 10*3/uL (ref 0.7–3.1)
MCH: 30.5 pg (ref 26.6–33.0)
MCHC: 33.3 g/dL (ref 31.5–35.7)
MCV: 91 fL (ref 79–97)
MONOCYTES: 6 %
Monocytes Absolute: 0.6 10*3/uL (ref 0.1–0.9)
NEUTROS ABS: 7.5 10*3/uL — AB (ref 1.4–7.0)
NEUTROS PCT: 78 %
PLATELETS: 314 10*3/uL (ref 150–379)
RBC: 3.94 x10E6/uL (ref 3.77–5.28)
RDW: 13.9 % (ref 12.3–15.4)
WBC: 9.7 10*3/uL (ref 3.4–10.8)

## 2016-06-04 LAB — BASIC METABOLIC PANEL
BUN/Creatinine Ratio: 16 (ref 12–28)
BUN: 33 mg/dL — AB (ref 8–27)
CALCIUM: 10.7 mg/dL — AB (ref 8.7–10.3)
CO2: 27 mmol/L (ref 18–29)
Chloride: 87 mmol/L — ABNORMAL LOW (ref 96–106)
Creatinine, Ser: 2.11 mg/dL — ABNORMAL HIGH (ref 0.57–1.00)
GFR, EST AFRICAN AMERICAN: 25 mL/min/{1.73_m2} — AB (ref >59)
GFR, EST NON AFRICAN AMERICAN: 21 mL/min/{1.73_m2} — AB (ref >59)
Glucose: 216 mg/dL — ABNORMAL HIGH (ref 65–99)
Potassium: 3.3 mmol/L — ABNORMAL LOW (ref 3.5–5.2)
Sodium: 135 mmol/L (ref 134–144)

## 2016-06-04 LAB — PRO B NATRIURETIC PEPTIDE: NT-Pro BNP: 400 pg/mL (ref 0–738)

## 2016-06-04 NOTE — Patient Instructions (Addendum)
Medication Instructions:    Your physician recommends that you continue on your current medications as directed. Please refer to the Current Medication list given to you today.  Labwork:  Today: BMET, CBC w/ diff & BNP  Testing/Procedures:  None ordered  Follow-Up:  Your physician recommends that you schedule a follow-up appointment in: 3 months with Dr. Meda Coffee.  - If you need a refill on your cardiac medications before your next appointment, please call your pharmacy.   Thank you for choosing [facility]!!

## 2016-06-05 ENCOUNTER — Telehealth: Payer: Self-pay | Admitting: *Deleted

## 2016-06-05 DIAGNOSIS — L03115 Cellulitis of right lower limb: Secondary | ICD-10-CM | POA: Diagnosis not present

## 2016-06-05 DIAGNOSIS — E785 Hyperlipidemia, unspecified: Secondary | ICD-10-CM | POA: Diagnosis not present

## 2016-06-05 DIAGNOSIS — J449 Chronic obstructive pulmonary disease, unspecified: Secondary | ICD-10-CM | POA: Diagnosis not present

## 2016-06-05 DIAGNOSIS — E119 Type 2 diabetes mellitus without complications: Secondary | ICD-10-CM | POA: Diagnosis not present

## 2016-06-05 DIAGNOSIS — M6281 Muscle weakness (generalized): Secondary | ICD-10-CM | POA: Diagnosis not present

## 2016-06-05 DIAGNOSIS — Z79899 Other long term (current) drug therapy: Secondary | ICD-10-CM

## 2016-06-05 DIAGNOSIS — I10 Essential (primary) hypertension: Secondary | ICD-10-CM | POA: Diagnosis not present

## 2016-06-05 MED ORDER — POTASSIUM CHLORIDE CRYS ER 20 MEQ PO TBCR: 20.0000 meq | EXTENDED_RELEASE_TABLET | Freq: Every day | ORAL | 3 refills | Status: AC

## 2016-06-05 MED ORDER — FUROSEMIDE 40 MG PO TABS: 40.0000 mg | ORAL_TABLET | Freq: Every day | ORAL | 0 refills | Status: AC

## 2016-06-05 NOTE — Addendum Note (Signed)
Addended by: Clarnce Flock E on: 06/05/2016 11:43 AM   Modules accepted: Orders

## 2016-06-05 NOTE — Telephone Encounter (Signed)
-----   Message from Charlie Pitter, [place] sent at 06/04/2016  5:16 PM EDT ----- Please let patient know labs show she seems to be more on the drier side now but similar to where she was in 04/2016. Reduce Lasix to once a day and add KCl 38meq daily. Repeat BMET 1 week.

## 2016-06-06 DIAGNOSIS — E119 Type 2 diabetes mellitus without complications: Secondary | ICD-10-CM | POA: Diagnosis not present

## 2016-06-06 DIAGNOSIS — M6281 Muscle weakness (generalized): Secondary | ICD-10-CM | POA: Diagnosis not present

## 2016-06-06 DIAGNOSIS — L03115 Cellulitis of right lower limb: Secondary | ICD-10-CM | POA: Diagnosis not present

## 2016-06-06 DIAGNOSIS — E785 Hyperlipidemia, unspecified: Secondary | ICD-10-CM | POA: Diagnosis not present

## 2016-06-06 DIAGNOSIS — J449 Chronic obstructive pulmonary disease, unspecified: Secondary | ICD-10-CM | POA: Diagnosis not present

## 2016-06-06 DIAGNOSIS — I10 Essential (primary) hypertension: Secondary | ICD-10-CM | POA: Diagnosis not present

## 2016-06-07 DIAGNOSIS — E119 Type 2 diabetes mellitus without complications: Secondary | ICD-10-CM | POA: Diagnosis not present

## 2016-06-07 DIAGNOSIS — J449 Chronic obstructive pulmonary disease, unspecified: Secondary | ICD-10-CM | POA: Diagnosis not present

## 2016-06-07 DIAGNOSIS — E785 Hyperlipidemia, unspecified: Secondary | ICD-10-CM | POA: Diagnosis not present

## 2016-06-07 DIAGNOSIS — M6281 Muscle weakness (generalized): Secondary | ICD-10-CM | POA: Diagnosis not present

## 2016-06-07 DIAGNOSIS — I10 Essential (primary) hypertension: Secondary | ICD-10-CM | POA: Diagnosis not present

## 2016-06-07 DIAGNOSIS — L03115 Cellulitis of right lower limb: Secondary | ICD-10-CM | POA: Diagnosis not present

## 2016-06-10 DIAGNOSIS — J449 Chronic obstructive pulmonary disease, unspecified: Secondary | ICD-10-CM | POA: Diagnosis not present

## 2016-06-10 DIAGNOSIS — M6281 Muscle weakness (generalized): Secondary | ICD-10-CM | POA: Diagnosis not present

## 2016-06-10 DIAGNOSIS — L03115 Cellulitis of right lower limb: Secondary | ICD-10-CM | POA: Diagnosis not present

## 2016-06-10 DIAGNOSIS — E785 Hyperlipidemia, unspecified: Secondary | ICD-10-CM | POA: Diagnosis not present

## 2016-06-10 DIAGNOSIS — E119 Type 2 diabetes mellitus without complications: Secondary | ICD-10-CM | POA: Diagnosis not present

## 2016-06-10 DIAGNOSIS — I10 Essential (primary) hypertension: Secondary | ICD-10-CM | POA: Diagnosis not present

## 2016-06-12 DIAGNOSIS — M6281 Muscle weakness (generalized): Secondary | ICD-10-CM | POA: Diagnosis not present

## 2016-06-12 DIAGNOSIS — I10 Essential (primary) hypertension: Secondary | ICD-10-CM | POA: Diagnosis not present

## 2016-06-12 DIAGNOSIS — L03115 Cellulitis of right lower limb: Secondary | ICD-10-CM | POA: Diagnosis not present

## 2016-06-12 DIAGNOSIS — E119 Type 2 diabetes mellitus without complications: Secondary | ICD-10-CM | POA: Diagnosis not present

## 2016-06-12 DIAGNOSIS — J449 Chronic obstructive pulmonary disease, unspecified: Secondary | ICD-10-CM | POA: Diagnosis not present

## 2016-06-12 DIAGNOSIS — E785 Hyperlipidemia, unspecified: Secondary | ICD-10-CM | POA: Diagnosis not present

## 2016-06-13 ENCOUNTER — Other Ambulatory Visit: Payer: Medicare Other | Admitting: *Deleted

## 2016-06-13 DIAGNOSIS — Z79899 Other long term (current) drug therapy: Secondary | ICD-10-CM

## 2016-06-14 ENCOUNTER — Other Ambulatory Visit: Payer: Medicare Other

## 2016-06-14 DIAGNOSIS — J449 Chronic obstructive pulmonary disease, unspecified: Secondary | ICD-10-CM | POA: Diagnosis not present

## 2016-06-14 DIAGNOSIS — E785 Hyperlipidemia, unspecified: Secondary | ICD-10-CM | POA: Diagnosis not present

## 2016-06-14 DIAGNOSIS — E119 Type 2 diabetes mellitus without complications: Secondary | ICD-10-CM | POA: Diagnosis not present

## 2016-06-14 DIAGNOSIS — I10 Essential (primary) hypertension: Secondary | ICD-10-CM | POA: Diagnosis not present

## 2016-06-14 DIAGNOSIS — M6281 Muscle weakness (generalized): Secondary | ICD-10-CM | POA: Diagnosis not present

## 2016-06-14 DIAGNOSIS — L03115 Cellulitis of right lower limb: Secondary | ICD-10-CM | POA: Diagnosis not present

## 2016-06-14 LAB — BASIC METABOLIC PANEL
BUN/Creatinine Ratio: 19 (ref 12–28)
BUN: 36 mg/dL — AB (ref 8–27)
CALCIUM: 10 mg/dL (ref 8.7–10.3)
CO2: 27 mmol/L (ref 18–29)
Chloride: 92 mmol/L — ABNORMAL LOW (ref 96–106)
Creatinine, Ser: 1.85 mg/dL — ABNORMAL HIGH (ref 0.57–1.00)
GFR, EST AFRICAN AMERICAN: 29 mL/min/{1.73_m2} — AB (ref >59)
GFR, EST NON AFRICAN AMERICAN: 25 mL/min/{1.73_m2} — AB (ref >59)
Glucose: 314 mg/dL — ABNORMAL HIGH (ref 65–99)
POTASSIUM: 3.9 mmol/L (ref 3.5–5.2)
Sodium: 139 mmol/L (ref 134–144)

## 2016-06-17 DIAGNOSIS — M6281 Muscle weakness (generalized): Secondary | ICD-10-CM | POA: Diagnosis not present

## 2016-06-17 DIAGNOSIS — I10 Essential (primary) hypertension: Secondary | ICD-10-CM | POA: Diagnosis not present

## 2016-06-17 DIAGNOSIS — E785 Hyperlipidemia, unspecified: Secondary | ICD-10-CM | POA: Diagnosis not present

## 2016-06-17 DIAGNOSIS — L03115 Cellulitis of right lower limb: Secondary | ICD-10-CM | POA: Diagnosis not present

## 2016-06-17 DIAGNOSIS — E119 Type 2 diabetes mellitus without complications: Secondary | ICD-10-CM | POA: Diagnosis not present

## 2016-06-17 DIAGNOSIS — J449 Chronic obstructive pulmonary disease, unspecified: Secondary | ICD-10-CM | POA: Diagnosis not present

## 2016-06-18 DIAGNOSIS — E119 Type 2 diabetes mellitus without complications: Secondary | ICD-10-CM | POA: Diagnosis not present

## 2016-06-18 DIAGNOSIS — I10 Essential (primary) hypertension: Secondary | ICD-10-CM | POA: Diagnosis not present

## 2016-06-18 DIAGNOSIS — E785 Hyperlipidemia, unspecified: Secondary | ICD-10-CM | POA: Diagnosis not present

## 2016-06-18 DIAGNOSIS — J449 Chronic obstructive pulmonary disease, unspecified: Secondary | ICD-10-CM | POA: Diagnosis not present

## 2016-06-18 DIAGNOSIS — L03115 Cellulitis of right lower limb: Secondary | ICD-10-CM | POA: Diagnosis not present

## 2016-06-18 DIAGNOSIS — M6281 Muscle weakness (generalized): Secondary | ICD-10-CM | POA: Diagnosis not present

## 2016-06-21 DIAGNOSIS — S81802A Unspecified open wound, left lower leg, initial encounter: Secondary | ICD-10-CM | POA: Diagnosis not present

## 2016-06-24 DIAGNOSIS — M6281 Muscle weakness (generalized): Secondary | ICD-10-CM | POA: Diagnosis not present

## 2016-06-24 DIAGNOSIS — E785 Hyperlipidemia, unspecified: Secondary | ICD-10-CM | POA: Diagnosis not present

## 2016-06-24 DIAGNOSIS — E119 Type 2 diabetes mellitus without complications: Secondary | ICD-10-CM | POA: Diagnosis not present

## 2016-06-24 DIAGNOSIS — L03115 Cellulitis of right lower limb: Secondary | ICD-10-CM | POA: Diagnosis not present

## 2016-06-24 DIAGNOSIS — J449 Chronic obstructive pulmonary disease, unspecified: Secondary | ICD-10-CM | POA: Diagnosis not present

## 2016-06-24 DIAGNOSIS — I10 Essential (primary) hypertension: Secondary | ICD-10-CM | POA: Diagnosis not present

## 2016-06-26 DIAGNOSIS — E785 Hyperlipidemia, unspecified: Secondary | ICD-10-CM | POA: Diagnosis not present

## 2016-06-26 DIAGNOSIS — M6281 Muscle weakness (generalized): Secondary | ICD-10-CM | POA: Diagnosis not present

## 2016-06-26 DIAGNOSIS — E119 Type 2 diabetes mellitus without complications: Secondary | ICD-10-CM | POA: Diagnosis not present

## 2016-06-26 DIAGNOSIS — I10 Essential (primary) hypertension: Secondary | ICD-10-CM | POA: Diagnosis not present

## 2016-06-26 DIAGNOSIS — L03115 Cellulitis of right lower limb: Secondary | ICD-10-CM | POA: Diagnosis not present

## 2016-06-26 DIAGNOSIS — J449 Chronic obstructive pulmonary disease, unspecified: Secondary | ICD-10-CM | POA: Diagnosis not present

## 2016-06-28 DIAGNOSIS — E119 Type 2 diabetes mellitus without complications: Secondary | ICD-10-CM | POA: Diagnosis not present

## 2016-06-28 DIAGNOSIS — E785 Hyperlipidemia, unspecified: Secondary | ICD-10-CM | POA: Diagnosis not present

## 2016-06-28 DIAGNOSIS — I10 Essential (primary) hypertension: Secondary | ICD-10-CM | POA: Diagnosis not present

## 2016-06-28 DIAGNOSIS — L03115 Cellulitis of right lower limb: Secondary | ICD-10-CM | POA: Diagnosis not present

## 2016-06-28 DIAGNOSIS — J449 Chronic obstructive pulmonary disease, unspecified: Secondary | ICD-10-CM | POA: Diagnosis not present

## 2016-06-28 DIAGNOSIS — M6281 Muscle weakness (generalized): Secondary | ICD-10-CM | POA: Diagnosis not present

## 2016-07-02 DIAGNOSIS — J449 Chronic obstructive pulmonary disease, unspecified: Secondary | ICD-10-CM | POA: Diagnosis not present

## 2016-07-02 DIAGNOSIS — M6281 Muscle weakness (generalized): Secondary | ICD-10-CM | POA: Diagnosis not present

## 2016-07-02 DIAGNOSIS — E119 Type 2 diabetes mellitus without complications: Secondary | ICD-10-CM | POA: Diagnosis not present

## 2016-07-02 DIAGNOSIS — I10 Essential (primary) hypertension: Secondary | ICD-10-CM | POA: Diagnosis not present

## 2016-07-02 DIAGNOSIS — L03115 Cellulitis of right lower limb: Secondary | ICD-10-CM | POA: Diagnosis not present

## 2016-07-02 DIAGNOSIS — E785 Hyperlipidemia, unspecified: Secondary | ICD-10-CM | POA: Diagnosis not present

## 2016-07-05 DIAGNOSIS — E119 Type 2 diabetes mellitus without complications: Secondary | ICD-10-CM | POA: Diagnosis not present

## 2016-07-05 DIAGNOSIS — M1000 Idiopathic gout, unspecified site: Secondary | ICD-10-CM | POA: Diagnosis not present

## 2016-07-05 DIAGNOSIS — K5900 Constipation, unspecified: Secondary | ICD-10-CM | POA: Diagnosis not present

## 2016-07-05 DIAGNOSIS — R100 Acute abdomen: Secondary | ICD-10-CM | POA: Diagnosis not present

## 2016-07-05 DIAGNOSIS — I10 Essential (primary) hypertension: Secondary | ICD-10-CM | POA: Diagnosis not present

## 2016-07-05 DIAGNOSIS — I1310 Hypertensive heart and chronic kidney disease without heart failure, with stage 1 through stage 4 chronic kidney disease, or unspecified chronic kidney disease: Secondary | ICD-10-CM | POA: Diagnosis not present

## 2016-07-09 DIAGNOSIS — L03115 Cellulitis of right lower limb: Secondary | ICD-10-CM | POA: Diagnosis not present

## 2016-07-09 DIAGNOSIS — M6281 Muscle weakness (generalized): Secondary | ICD-10-CM | POA: Diagnosis not present

## 2016-07-09 DIAGNOSIS — I10 Essential (primary) hypertension: Secondary | ICD-10-CM | POA: Diagnosis not present

## 2016-07-09 DIAGNOSIS — J449 Chronic obstructive pulmonary disease, unspecified: Secondary | ICD-10-CM | POA: Diagnosis not present

## 2016-07-09 DIAGNOSIS — E785 Hyperlipidemia, unspecified: Secondary | ICD-10-CM | POA: Diagnosis not present

## 2016-07-09 DIAGNOSIS — E119 Type 2 diabetes mellitus without complications: Secondary | ICD-10-CM | POA: Diagnosis not present

## 2016-07-17 DIAGNOSIS — J449 Chronic obstructive pulmonary disease, unspecified: Secondary | ICD-10-CM | POA: Diagnosis not present

## 2016-07-17 DIAGNOSIS — E119 Type 2 diabetes mellitus without complications: Secondary | ICD-10-CM | POA: Diagnosis not present

## 2016-07-17 DIAGNOSIS — E785 Hyperlipidemia, unspecified: Secondary | ICD-10-CM | POA: Diagnosis not present

## 2016-07-17 DIAGNOSIS — I10 Essential (primary) hypertension: Secondary | ICD-10-CM | POA: Diagnosis not present

## 2016-07-17 DIAGNOSIS — M6281 Muscle weakness (generalized): Secondary | ICD-10-CM | POA: Diagnosis not present

## 2016-07-17 DIAGNOSIS — L03115 Cellulitis of right lower limb: Secondary | ICD-10-CM | POA: Diagnosis not present

## 2016-07-19 DIAGNOSIS — M6281 Muscle weakness (generalized): Secondary | ICD-10-CM | POA: Diagnosis not present

## 2016-07-19 DIAGNOSIS — L03115 Cellulitis of right lower limb: Secondary | ICD-10-CM | POA: Diagnosis not present

## 2016-07-19 DIAGNOSIS — I10 Essential (primary) hypertension: Secondary | ICD-10-CM | POA: Diagnosis not present

## 2016-07-19 DIAGNOSIS — E785 Hyperlipidemia, unspecified: Secondary | ICD-10-CM | POA: Diagnosis not present

## 2016-07-19 DIAGNOSIS — J449 Chronic obstructive pulmonary disease, unspecified: Secondary | ICD-10-CM | POA: Diagnosis not present

## 2016-07-19 DIAGNOSIS — E119 Type 2 diabetes mellitus without complications: Secondary | ICD-10-CM | POA: Diagnosis not present

## 2016-07-20 DIAGNOSIS — R609 Edema, unspecified: Secondary | ICD-10-CM | POA: Diagnosis not present

## 2016-07-20 DIAGNOSIS — I10 Essential (primary) hypertension: Secondary | ICD-10-CM | POA: Diagnosis not present

## 2016-07-20 DIAGNOSIS — E119 Type 2 diabetes mellitus without complications: Secondary | ICD-10-CM | POA: Diagnosis not present

## 2016-07-20 DIAGNOSIS — J449 Chronic obstructive pulmonary disease, unspecified: Secondary | ICD-10-CM | POA: Diagnosis not present

## 2016-07-20 DIAGNOSIS — M6281 Muscle weakness (generalized): Secondary | ICD-10-CM | POA: Diagnosis not present

## 2016-07-20 DIAGNOSIS — Z7984 Long term (current) use of oral hypoglycemic drugs: Secondary | ICD-10-CM | POA: Diagnosis not present

## 2016-07-24 DIAGNOSIS — Z7984 Long term (current) use of oral hypoglycemic drugs: Secondary | ICD-10-CM | POA: Diagnosis not present

## 2016-07-24 DIAGNOSIS — E119 Type 2 diabetes mellitus without complications: Secondary | ICD-10-CM | POA: Diagnosis not present

## 2016-07-24 DIAGNOSIS — M6281 Muscle weakness (generalized): Secondary | ICD-10-CM | POA: Diagnosis not present

## 2016-07-24 DIAGNOSIS — J449 Chronic obstructive pulmonary disease, unspecified: Secondary | ICD-10-CM | POA: Diagnosis not present

## 2016-07-24 DIAGNOSIS — I10 Essential (primary) hypertension: Secondary | ICD-10-CM | POA: Diagnosis not present

## 2016-07-24 DIAGNOSIS — R609 Edema, unspecified: Secondary | ICD-10-CM | POA: Diagnosis not present

## 2016-07-26 DIAGNOSIS — E119 Type 2 diabetes mellitus without complications: Secondary | ICD-10-CM | POA: Diagnosis not present

## 2016-07-26 DIAGNOSIS — J449 Chronic obstructive pulmonary disease, unspecified: Secondary | ICD-10-CM | POA: Diagnosis not present

## 2016-07-26 DIAGNOSIS — Z7984 Long term (current) use of oral hypoglycemic drugs: Secondary | ICD-10-CM | POA: Diagnosis not present

## 2016-07-26 DIAGNOSIS — R609 Edema, unspecified: Secondary | ICD-10-CM | POA: Diagnosis not present

## 2016-07-26 DIAGNOSIS — M6281 Muscle weakness (generalized): Secondary | ICD-10-CM | POA: Diagnosis not present

## 2016-07-26 DIAGNOSIS — I10 Essential (primary) hypertension: Secondary | ICD-10-CM | POA: Diagnosis not present

## 2016-07-31 DIAGNOSIS — E119 Type 2 diabetes mellitus without complications: Secondary | ICD-10-CM | POA: Diagnosis not present

## 2016-07-31 DIAGNOSIS — J449 Chronic obstructive pulmonary disease, unspecified: Secondary | ICD-10-CM | POA: Diagnosis not present

## 2016-07-31 DIAGNOSIS — Z7984 Long term (current) use of oral hypoglycemic drugs: Secondary | ICD-10-CM | POA: Diagnosis not present

## 2016-07-31 DIAGNOSIS — I10 Essential (primary) hypertension: Secondary | ICD-10-CM | POA: Diagnosis not present

## 2016-07-31 DIAGNOSIS — R609 Edema, unspecified: Secondary | ICD-10-CM | POA: Diagnosis not present

## 2016-07-31 DIAGNOSIS — M6281 Muscle weakness (generalized): Secondary | ICD-10-CM | POA: Diagnosis not present

## 2016-08-06 DIAGNOSIS — Z7984 Long term (current) use of oral hypoglycemic drugs: Secondary | ICD-10-CM | POA: Diagnosis not present

## 2016-08-06 DIAGNOSIS — J449 Chronic obstructive pulmonary disease, unspecified: Secondary | ICD-10-CM | POA: Diagnosis not present

## 2016-08-06 DIAGNOSIS — R609 Edema, unspecified: Secondary | ICD-10-CM | POA: Diagnosis not present

## 2016-08-06 DIAGNOSIS — E119 Type 2 diabetes mellitus without complications: Secondary | ICD-10-CM | POA: Diagnosis not present

## 2016-08-06 DIAGNOSIS — I10 Essential (primary) hypertension: Secondary | ICD-10-CM | POA: Diagnosis not present

## 2016-08-06 DIAGNOSIS — M6281 Muscle weakness (generalized): Secondary | ICD-10-CM | POA: Diagnosis not present

## 2016-08-14 DIAGNOSIS — E119 Type 2 diabetes mellitus without complications: Secondary | ICD-10-CM | POA: Diagnosis not present

## 2016-08-14 DIAGNOSIS — I10 Essential (primary) hypertension: Secondary | ICD-10-CM | POA: Diagnosis not present

## 2016-08-15 DIAGNOSIS — M6281 Muscle weakness (generalized): Secondary | ICD-10-CM | POA: Diagnosis not present

## 2016-08-15 DIAGNOSIS — I10 Essential (primary) hypertension: Secondary | ICD-10-CM | POA: Diagnosis not present

## 2016-08-15 DIAGNOSIS — E119 Type 2 diabetes mellitus without complications: Secondary | ICD-10-CM | POA: Diagnosis not present

## 2016-08-15 DIAGNOSIS — R609 Edema, unspecified: Secondary | ICD-10-CM | POA: Diagnosis not present

## 2016-08-15 DIAGNOSIS — Z7984 Long term (current) use of oral hypoglycemic drugs: Secondary | ICD-10-CM | POA: Diagnosis not present

## 2016-08-15 DIAGNOSIS — J449 Chronic obstructive pulmonary disease, unspecified: Secondary | ICD-10-CM | POA: Diagnosis not present

## 2016-08-19 DIAGNOSIS — E119 Type 2 diabetes mellitus without complications: Secondary | ICD-10-CM | POA: Diagnosis not present

## 2016-08-25 ENCOUNTER — Inpatient Hospital Stay (HOSPITAL_COMMUNITY)
Admission: EM | Admit: 2016-08-25 | Discharge: 2016-09-04 | DRG: 189 | Disposition: E | Payer: Medicare Other | Attending: Internal Medicine | Admitting: Internal Medicine

## 2016-08-25 ENCOUNTER — Emergency Department (HOSPITAL_COMMUNITY): Payer: Medicare Other

## 2016-08-25 ENCOUNTER — Encounter (HOSPITAL_COMMUNITY): Payer: Self-pay | Admitting: Emergency Medicine

## 2016-08-25 DIAGNOSIS — J9601 Acute respiratory failure with hypoxia: Secondary | ICD-10-CM | POA: Diagnosis not present

## 2016-08-25 DIAGNOSIS — E1122 Type 2 diabetes mellitus with diabetic chronic kidney disease: Secondary | ICD-10-CM | POA: Diagnosis present

## 2016-08-25 DIAGNOSIS — Z7984 Long term (current) use of oral hypoglycemic drugs: Secondary | ICD-10-CM

## 2016-08-25 DIAGNOSIS — R05 Cough: Secondary | ICD-10-CM | POA: Diagnosis not present

## 2016-08-25 DIAGNOSIS — E871 Hypo-osmolality and hyponatremia: Secondary | ICD-10-CM | POA: Diagnosis not present

## 2016-08-25 DIAGNOSIS — N185 Chronic kidney disease, stage 5: Secondary | ICD-10-CM | POA: Diagnosis not present

## 2016-08-25 DIAGNOSIS — E039 Hypothyroidism, unspecified: Secondary | ICD-10-CM | POA: Diagnosis not present

## 2016-08-25 DIAGNOSIS — Z7901 Long term (current) use of anticoagulants: Secondary | ICD-10-CM

## 2016-08-25 DIAGNOSIS — J441 Chronic obstructive pulmonary disease with (acute) exacerbation: Secondary | ICD-10-CM | POA: Diagnosis not present

## 2016-08-25 DIAGNOSIS — I4891 Unspecified atrial fibrillation: Secondary | ICD-10-CM | POA: Diagnosis present

## 2016-08-25 DIAGNOSIS — Z8249 Family history of ischemic heart disease and other diseases of the circulatory system: Secondary | ICD-10-CM

## 2016-08-25 DIAGNOSIS — I5021 Acute systolic (congestive) heart failure: Secondary | ICD-10-CM | POA: Diagnosis not present

## 2016-08-25 DIAGNOSIS — G9341 Metabolic encephalopathy: Secondary | ICD-10-CM | POA: Diagnosis present

## 2016-08-25 DIAGNOSIS — R579 Shock, unspecified: Secondary | ICD-10-CM | POA: Diagnosis present

## 2016-08-25 DIAGNOSIS — Z79899 Other long term (current) drug therapy: Secondary | ICD-10-CM

## 2016-08-25 DIAGNOSIS — I483 Typical atrial flutter: Secondary | ICD-10-CM | POA: Diagnosis present

## 2016-08-25 DIAGNOSIS — J181 Lobar pneumonia, unspecified organism: Secondary | ICD-10-CM | POA: Diagnosis not present

## 2016-08-25 DIAGNOSIS — E11319 Type 2 diabetes mellitus with unspecified diabetic retinopathy without macular edema: Secondary | ICD-10-CM | POA: Diagnosis not present

## 2016-08-25 DIAGNOSIS — R0602 Shortness of breath: Secondary | ICD-10-CM

## 2016-08-25 DIAGNOSIS — N179 Acute kidney failure, unspecified: Secondary | ICD-10-CM | POA: Diagnosis not present

## 2016-08-25 DIAGNOSIS — N184 Chronic kidney disease, stage 4 (severe): Secondary | ICD-10-CM | POA: Diagnosis not present

## 2016-08-25 DIAGNOSIS — J9620 Acute and chronic respiratory failure, unspecified whether with hypoxia or hypercapnia: Secondary | ICD-10-CM | POA: Diagnosis not present

## 2016-08-25 DIAGNOSIS — E876 Hypokalemia: Secondary | ICD-10-CM | POA: Diagnosis present

## 2016-08-25 DIAGNOSIS — I5032 Chronic diastolic (congestive) heart failure: Secondary | ICD-10-CM | POA: Diagnosis not present

## 2016-08-25 DIAGNOSIS — F329 Major depressive disorder, single episode, unspecified: Secondary | ICD-10-CM | POA: Diagnosis present

## 2016-08-25 DIAGNOSIS — Z515 Encounter for palliative care: Secondary | ICD-10-CM | POA: Diagnosis not present

## 2016-08-25 DIAGNOSIS — J9621 Acute and chronic respiratory failure with hypoxia: Secondary | ICD-10-CM | POA: Diagnosis not present

## 2016-08-25 DIAGNOSIS — N183 Chronic kidney disease, stage 3 (moderate): Secondary | ICD-10-CM

## 2016-08-25 DIAGNOSIS — Z888 Allergy status to other drugs, medicaments and biological substances status: Secondary | ICD-10-CM

## 2016-08-25 DIAGNOSIS — I132 Hypertensive heart and chronic kidney disease with heart failure and with stage 5 chronic kidney disease, or end stage renal disease: Secondary | ICD-10-CM | POA: Diagnosis not present

## 2016-08-25 DIAGNOSIS — E669 Obesity, unspecified: Secondary | ICD-10-CM | POA: Diagnosis present

## 2016-08-25 DIAGNOSIS — J9622 Acute and chronic respiratory failure with hypercapnia: Secondary | ICD-10-CM | POA: Diagnosis not present

## 2016-08-25 DIAGNOSIS — E1121 Type 2 diabetes mellitus with diabetic nephropathy: Secondary | ICD-10-CM | POA: Diagnosis present

## 2016-08-25 DIAGNOSIS — J449 Chronic obstructive pulmonary disease, unspecified: Secondary | ICD-10-CM | POA: Diagnosis present

## 2016-08-25 DIAGNOSIS — R0902 Hypoxemia: Secondary | ICD-10-CM | POA: Diagnosis not present

## 2016-08-25 DIAGNOSIS — E872 Acidosis: Secondary | ICD-10-CM | POA: Diagnosis not present

## 2016-08-25 DIAGNOSIS — I5043 Acute on chronic combined systolic (congestive) and diastolic (congestive) heart failure: Secondary | ICD-10-CM | POA: Diagnosis present

## 2016-08-25 DIAGNOSIS — N170 Acute kidney failure with tubular necrosis: Secondary | ICD-10-CM

## 2016-08-25 DIAGNOSIS — I5031 Acute diastolic (congestive) heart failure: Secondary | ICD-10-CM | POA: Diagnosis present

## 2016-08-25 DIAGNOSIS — E8770 Fluid overload, unspecified: Secondary | ICD-10-CM | POA: Diagnosis not present

## 2016-08-25 DIAGNOSIS — J189 Pneumonia, unspecified organism: Secondary | ICD-10-CM | POA: Diagnosis present

## 2016-08-25 DIAGNOSIS — J9690 Respiratory failure, unspecified, unspecified whether with hypoxia or hypercapnia: Secondary | ICD-10-CM

## 2016-08-25 DIAGNOSIS — Z6836 Body mass index (BMI) 36.0-36.9, adult: Secondary | ICD-10-CM

## 2016-08-25 DIAGNOSIS — E875 Hyperkalemia: Secondary | ICD-10-CM | POA: Diagnosis present

## 2016-08-25 DIAGNOSIS — I480 Paroxysmal atrial fibrillation: Secondary | ICD-10-CM | POA: Diagnosis present

## 2016-08-25 DIAGNOSIS — R34 Anuria and oliguria: Secondary | ICD-10-CM | POA: Diagnosis not present

## 2016-08-25 DIAGNOSIS — Z9981 Dependence on supplemental oxygen: Secondary | ICD-10-CM

## 2016-08-25 DIAGNOSIS — J440 Chronic obstructive pulmonary disease with acute lower respiratory infection: Secondary | ICD-10-CM | POA: Diagnosis present

## 2016-08-25 DIAGNOSIS — N189 Chronic kidney disease, unspecified: Secondary | ICD-10-CM

## 2016-08-25 DIAGNOSIS — F17200 Nicotine dependence, unspecified, uncomplicated: Secondary | ICD-10-CM | POA: Diagnosis present

## 2016-08-25 HISTORY — DX: Chronic kidney disease, stage 4 (severe): N18.4

## 2016-08-25 HISTORY — DX: Chronic diastolic (congestive) heart failure: I50.32

## 2016-08-25 HISTORY — DX: Edema, unspecified: R60.9

## 2016-08-25 LAB — URINALYSIS, ROUTINE W REFLEX MICROSCOPIC
Bilirubin Urine: NEGATIVE
Glucose, UA: NEGATIVE mg/dL
Hgb urine dipstick: NEGATIVE
Ketones, ur: NEGATIVE mg/dL
Leukocytes, UA: NEGATIVE
Nitrite: NEGATIVE
Protein, ur: 30 mg/dL — AB
Specific Gravity, Urine: 1.014 (ref 1.005–1.030)
pH: 5 (ref 5.0–8.0)

## 2016-08-25 LAB — MRSA PCR SCREENING: MRSA by PCR: NEGATIVE

## 2016-08-25 LAB — BASIC METABOLIC PANEL
ANION GAP: 15 (ref 5–15)
BUN: 47 mg/dL — ABNORMAL HIGH (ref 6–20)
CALCIUM: 10 mg/dL (ref 8.9–10.3)
CO2: 24 mmol/L (ref 22–32)
Chloride: 91 mmol/L — ABNORMAL LOW (ref 101–111)
Creatinine, Ser: 2.52 mg/dL — ABNORMAL HIGH (ref 0.44–1.00)
GFR calc non Af Amer: 17 mL/min — ABNORMAL LOW (ref >60)
GFR, EST AFRICAN AMERICAN: 19 mL/min — AB (ref >60)
Glucose, Bld: 270 mg/dL — ABNORMAL HIGH (ref 65–99)
POTASSIUM: 4.8 mmol/L (ref 3.5–5.1)
Sodium: 130 mmol/L — ABNORMAL LOW (ref 135–145)

## 2016-08-25 LAB — GLUCOSE, CAPILLARY
Glucose-Capillary: 278 mg/dL — ABNORMAL HIGH (ref 65–99)
Glucose-Capillary: 297 mg/dL — ABNORMAL HIGH (ref 65–99)

## 2016-08-25 LAB — I-STAT CG4 LACTIC ACID, ED: LACTIC ACID, VENOUS: 1.84 mmol/L (ref 0.5–1.9)

## 2016-08-25 LAB — CBC
HEMATOCRIT: 37 % (ref 36.0–46.0)
HEMOGLOBIN: 11.6 g/dL — AB (ref 12.0–15.0)
MCH: 27.8 pg (ref 26.0–34.0)
MCHC: 31.4 g/dL (ref 30.0–36.0)
MCV: 88.5 fL (ref 78.0–100.0)
Platelets: 402 10*3/uL — ABNORMAL HIGH (ref 150–400)
RBC: 4.18 MIL/uL (ref 3.87–5.11)
RDW: 13.2 % (ref 11.5–15.5)
WBC: 15.9 10*3/uL — ABNORMAL HIGH (ref 4.0–10.5)

## 2016-08-25 LAB — I-STAT TROPONIN, ED: TROPONIN I, POC: 0.04 ng/mL (ref 0.00–0.08)

## 2016-08-25 LAB — STREP PNEUMONIAE URINARY ANTIGEN: STREP PNEUMO URINARY ANTIGEN: NEGATIVE

## 2016-08-25 LAB — BRAIN NATRIURETIC PEPTIDE: B Natriuretic Peptide: 389.5 pg/mL — ABNORMAL HIGH (ref 0.0–100.0)

## 2016-08-25 MED ORDER — ACETAMINOPHEN 325 MG PO TABS: 650.0000 mg | ORAL_TABLET | ORAL | Status: DC | PRN

## 2016-08-25 MED ORDER — SODIUM CHLORIDE 0.9 % IV BOLUS (SEPSIS): 1000.0000 mL | Freq: Once | INTRAVENOUS | Status: DC

## 2016-08-25 MED ORDER — CEFTRIAXONE SODIUM 1 G IJ SOLR
1.0000 g | Freq: Once | INTRAMUSCULAR | Status: AC
  Administered 2016-08-25: 1 g via INTRAVENOUS
  Filled 2016-08-25: qty 10

## 2016-08-25 MED ORDER — CEFTRIAXONE SODIUM 1 G IJ SOLR
1.0000 g | INTRAMUSCULAR | Status: DC
  Administered 2016-08-26 – 2016-08-29 (×4): 1 g via INTRAVENOUS
  Filled 2016-08-25 (×6): qty 10

## 2016-08-25 MED ORDER — METHYLPREDNISOLONE SODIUM SUCC 125 MG IJ SOLR
60.0000 mg | Freq: Two times a day (BID) | INTRAMUSCULAR | Status: DC
  Administered 2016-08-26 – 2016-08-27 (×3): 60 mg via INTRAVENOUS
  Filled 2016-08-25 (×3): qty 2

## 2016-08-25 MED ORDER — FUROSEMIDE 10 MG/ML IJ SOLN
60.0000 mg | Freq: Two times a day (BID) | INTRAMUSCULAR | Status: DC
  Administered 2016-08-25 – 2016-08-26 (×2): 60 mg via INTRAVENOUS
  Filled 2016-08-25 (×2): qty 6

## 2016-08-25 MED ORDER — AZITHROMYCIN 500 MG IV SOLR
500.0000 mg | INTRAVENOUS | Status: DC
Start: 2016-08-25 — End: 2016-08-30
  Administered 2016-08-25 – 2016-08-29 (×5): 500 mg via INTRAVENOUS
  Filled 2016-08-25 (×6): qty 500

## 2016-08-25 MED ORDER — INSULIN ASPART 100 UNIT/ML ~~LOC~~ SOLN
0.0000 [IU] | Freq: Every day | SUBCUTANEOUS | Status: DC
  Administered 2016-08-25: 3 [IU] via SUBCUTANEOUS
  Administered 2016-08-27 – 2016-08-28 (×2): 2 [IU] via SUBCUTANEOUS

## 2016-08-25 MED ORDER — DEXTROSE 5 % IV SOLN
5.0000 mg/h | INTRAVENOUS | Status: DC
  Filled 2016-08-25: qty 100

## 2016-08-25 MED ORDER — HYDROCODONE-ACETAMINOPHEN 7.5-325 MG PO TABS
1.0000 | ORAL_TABLET | Freq: Three times a day (TID) | ORAL | Status: DC | PRN
  Administered 2016-08-28: 1 via ORAL
  Filled 2016-08-25 (×2): qty 1

## 2016-08-25 MED ORDER — ORAL CARE MOUTH RINSE
15.0000 mL | Freq: Two times a day (BID) | OROMUCOSAL | Status: DC
  Administered 2016-08-27 – 2016-08-29 (×4): 15 mL via OROMUCOSAL

## 2016-08-25 MED ORDER — ONDANSETRON HCL 4 MG/2ML IJ SOLN
4.0000 mg | Freq: Four times a day (QID) | INTRAMUSCULAR | Status: DC | PRN
  Administered 2016-08-29: 4 mg via INTRAVENOUS
  Filled 2016-08-25: qty 2

## 2016-08-25 MED ORDER — SODIUM CHLORIDE 0.9% FLUSH
3.0000 mL | Freq: Two times a day (BID) | INTRAVENOUS | Status: DC
  Administered 2016-08-25 – 2016-08-30 (×9): 3 mL via INTRAVENOUS

## 2016-08-25 MED ORDER — INSULIN ASPART 100 UNIT/ML ~~LOC~~ SOLN
0.0000 [IU] | Freq: Three times a day (TID) | SUBCUTANEOUS | Status: DC
  Administered 2016-08-26: 3 [IU] via SUBCUTANEOUS
  Administered 2016-08-26 – 2016-08-27 (×4): 5 [IU] via SUBCUTANEOUS
  Administered 2016-08-27: 11 [IU] via SUBCUTANEOUS
  Administered 2016-08-28: 2 [IU] via SUBCUTANEOUS
  Administered 2016-08-28: 3 [IU] via SUBCUTANEOUS
  Administered 2016-08-28: 5 [IU] via SUBCUTANEOUS

## 2016-08-25 MED ORDER — DILTIAZEM HCL 100 MG IV SOLR
5.0000 mg/h | INTRAVENOUS | Status: DC
  Administered 2016-08-25: 5 mg/h via INTRAVENOUS
  Administered 2016-08-26: 7.5 mg/h via INTRAVENOUS
  Filled 2016-08-25: qty 100

## 2016-08-25 MED ORDER — DILTIAZEM LOAD VIA INFUSION
20.0000 mg | Freq: Once | INTRAVENOUS | Status: DC
  Filled 2016-08-25: qty 20

## 2016-08-25 MED ORDER — DIAZEPAM 5 MG PO TABS
2.5000 mg | ORAL_TABLET | Freq: Every day | ORAL | Status: DC
  Administered 2016-08-25 – 2016-08-28 (×4): 2.5 mg via ORAL
  Filled 2016-08-25 (×4): qty 1

## 2016-08-25 MED ORDER — METHYLPREDNISOLONE SODIUM SUCC 125 MG IJ SOLR: 125.0000 mg | Freq: Once | INTRAMUSCULAR | Status: DC

## 2016-08-25 MED ORDER — APIXABAN 2.5 MG PO TABS
2.5000 mg | ORAL_TABLET | Freq: Two times a day (BID) | ORAL | Status: DC
  Administered 2016-08-25 – 2016-08-28 (×7): 2.5 mg via ORAL
  Filled 2016-08-25 (×7): qty 1

## 2016-08-25 MED ORDER — SODIUM CHLORIDE 0.9% FLUSH
3.0000 mL | INTRAVENOUS | Status: DC | PRN
  Administered 2016-08-30: 3 mL via INTRAVENOUS
  Filled 2016-08-25: qty 3

## 2016-08-25 MED ORDER — SODIUM CHLORIDE 0.9 % IV BOLUS (SEPSIS): 500.0000 mL | Freq: Once | INTRAVENOUS | Status: DC

## 2016-08-25 MED ORDER — GLIMEPIRIDE 4 MG PO TABS
8.0000 mg | ORAL_TABLET | Freq: Every day | ORAL | Status: DC
  Administered 2016-08-26: 8 mg via ORAL
  Filled 2016-08-25: qty 2

## 2016-08-25 MED ORDER — SODIUM CHLORIDE 0.9 % IV BOLUS (SEPSIS)
1000.0000 mL | Freq: Once | INTRAVENOUS | Status: AC
  Administered 2016-08-25: 1000 mL via INTRAVENOUS

## 2016-08-25 MED ORDER — DOXYCYCLINE HYCLATE 100 MG IV SOLR
100.0000 mg | Freq: Once | INTRAVENOUS | Status: DC
  Filled 2016-08-25: qty 100

## 2016-08-25 MED ORDER — POTASSIUM CHLORIDE CRYS ER 20 MEQ PO TBCR
20.0000 meq | EXTENDED_RELEASE_TABLET | Freq: Every day | ORAL | Status: DC
  Filled 2016-08-25: qty 1

## 2016-08-25 MED ORDER — AMITRIPTYLINE HCL 10 MG PO TABS
10.0000 mg | ORAL_TABLET | Freq: Every day | ORAL | Status: DC
  Administered 2016-08-25 – 2016-08-28 (×4): 10 mg via ORAL
  Filled 2016-08-25 (×4): qty 1

## 2016-08-25 MED ORDER — TIOTROPIUM BROMIDE MONOHYDRATE 18 MCG IN CAPS
18.0000 ug | ORAL_CAPSULE | Freq: Every day | RESPIRATORY_TRACT | Status: DC
  Administered 2016-08-26 – 2016-08-28 (×3): 18 ug via RESPIRATORY_TRACT
  Filled 2016-08-25: qty 5

## 2016-08-25 MED ORDER — LEVOTHYROXINE SODIUM 50 MCG PO TABS
50.0000 ug | ORAL_TABLET | Freq: Every day | ORAL | Status: DC
  Administered 2016-08-26 – 2016-08-28 (×3): 50 ug via ORAL
  Filled 2016-08-25 (×4): qty 1

## 2016-08-25 MED ORDER — DILTIAZEM LOAD VIA INFUSION
20.0000 mg | Freq: Once | INTRAVENOUS | Status: AC
  Administered 2016-08-25: 20 mg via INTRAVENOUS
  Filled 2016-08-25: qty 20

## 2016-08-25 MED ORDER — SPIRONOLACTONE 25 MG PO TABS
25.0000 mg | ORAL_TABLET | Freq: Every day | ORAL | Status: DC
  Filled 2016-08-25: qty 1

## 2016-08-25 MED ORDER — SODIUM CHLORIDE 0.9 % IV SOLN: 250.0000 mL | INTRAVENOUS | Status: DC | PRN

## 2016-08-25 NOTE — ED Notes (Signed)
Attempted to place pt on bedpan and she became SOB with movement.  Purewick external female catheter placed.

## 2016-08-25 NOTE — ED Notes (Signed)
Blood cultures being drawn at this time 

## 2016-08-25 NOTE — ED Provider Notes (Signed)
83 y.o. female with history of CKD stage IV (baseline Cr 2), COPD, NIDDM, hypothyroidism comes in with cc of DIB. Pt is an active smoker. She is noted to be in AF with RVR and is hypoxic. Pt reports gradually worsening dib x 1 week. She also has orthopnea and PND like symptoms. Pt is having subjective fevers, but states that even a temp of 98 is fever for her. Pt has a cough with clear phlegm. + JVD. Tachycardia and pitting edema.  Clinically, the decompensation appears to be both: cardio + pulmonary. It is quite possible that pt has a small infection, that led to AF with RVR and CHF. Alternatively, her symptoms could be all driven by AF with RVR - in which case we dont know what led to the decompensation of AF.  Pt had an admission 88 days ago. Pre-test probability for MDR bug is low - we will give her ceftriaxone and doxy. Pt is on O2  Per [place]. She is not unstable, so we will not cardiovert, instead we will treat the underlying process of likely infection and fluid overload. Dilt ordered with goal HR of 110.  Admit to step down.  Code status discussed. Pt wants at least 1 attempt of CPR if she has a cardiac arrest. She also would like to be intubated, but doesn't want to be on ventilator if it is futile.  I discussed with her that the likelihood of successful extubation would be very low for her, and something she wants to keep in the mind. For now pt is stable.   CRITICAL CARE Performed by: Varney Biles   Total critical care time: 33 minutes  Critical care time was exclusive of separately billable procedures and treating other patients.  Critical care was necessary to treat or prevent imminent or life-threatening deterioration.  Critical care was time spent personally by me on the following activities: development of treatment plan with patient and/or surrogate as well as nursing, discussions with consultants, evaluation of patient's response to treatment, examination of patient,  obtaining history from patient or surrogate, ordering and performing treatments and interventions, ordering and review of laboratory studies, ordering and review of radiographic studies, pulse oximetry and re-evaluation of patient's condition.     Varney Biles, MD 08/05/2016 [phone]

## 2016-08-25 NOTE — Plan of Care (Signed)
Problem: Education: Goal: Knowledge of [facility] Education information/materials will improve Outcome: Progressing Patient and her husband oriented to unit and hospital. Discussed pain rating scale and how to call for assistance. Voiced understanding. Patient updated on plan of care and how we will be treating her going forward.   Problem: Safety: Goal: Ability to remain free from injury will improve Outcome: Progressing Call light within reach, room uncluttered, and patient instructed on how to call for help. Bed alarm on.   Problem: Physical Regulation: Goal: Ability to maintain clinical measurements within normal limits will improve Outcome: Progressing Patient oxygen at an increased level from baseline home O2 in presence of PNA.   Problem: Skin Integrity: Goal: Risk for impaired skin integrity will decrease Outcome: Progressing Patient able to turn self.  Problem: Tissue Perfusion: Goal: Risk factors for ineffective tissue perfusion will decrease Outcome: Progressing Taking eliquis PO for prophylaxis.   Problem: Activity: Goal: Risk for activity intolerance will decrease Outcome: Progressing Patient turns self and able to use BSC.

## 2016-08-25 NOTE — H&P (Signed)
History and Physical  Kristy Mcdonald MGQ:[id number] DOB: 06-18-1932 DOA: 08/12/2016  Referring physician: Dr Kathrynn Humble, ED physician PCP: Lucianne Lei, MD  Outpatient Specialists:   Patient Coming From: home  Chief Complaint: SOB  HPI: Kristy Mcdonald is a 83 y.o. female with a history of COPD, chronic respiratory failure with home O2, straight 3 kidney disease, hypothyroidism, hypertension, paroxysmal atrial fibrillation (Chads 2 Vasc of 4) on eliquis. Patient presents with several days of worsening shortness of breath, worse with leg flat, exertion. He was improved with rest without production. She was treated a month ago for COPD exacerbation with steroids and a Z-Pak. She does admit to increasing leg swelling, weight gain.  Emergency Department Course: Chest x-ray suggestive of left lower lobe pneumonia with a white count of 15. Elevated BNP. Patient has been tachycardic despite fluid bolus. Code sepsis was called due to elevated white count and tachycardia with positive chest x-ray. Lactic acid 1.88. Fluids bolus was started, however I feel that the patient is not septic and will discontinue code sepsis.  Review of Systems:   Pt denies any fevers, chills, nausea, vomiting, diarrhea, constipation, abdominal pain, palpitations, headache, vision changes, lightheadedness, dizziness, melena, rectal bleeding.  Review of systems are otherwise negative  Past Medical History:  Diagnosis Date  . Chronic respiratory failure ([place])   . CKD (chronic kidney disease), stage III   . COPD (chronic obstructive pulmonary disease) ([place])   . Diabetes mellitus ([place])   . Hypertension   . Hypokalemia   . Hypothyroidism   . Lower extremity edema   . PAF (paroxysmal atrial fibrillation) ([place])    a. dx 05/2016.  . Tobacco abuse    History reviewed. No pertinent surgical history. Social History:  reports that she has been smoking.  She has never used smokeless tobacco. She reports that she does not  drink alcohol or use drugs. Patient lives at [facility]  . Lyrica [Pregabalin] Other (See Comments)    Causes fluid retention around both eyes  . Nicotine Diarrhea  . Prednisone Other (See Comments)    Causes shakiness and moodiness    Family History  Problem Relation Age of Onset  . CAD Father   . Hypertension Father   . Hypertension Sister   . Dementia Mother       Prior to Admission medications   Medication Sig Start Date End Date Taking? Authorizing Provider  albuterol (PROVENTIL HFA;VENTOLIN HFA) 108 (90 BASE) MCG/ACT inhaler Inhale 2 puffs into the lungs every 6 (six) hours as needed for wheezing or shortness of breath.   Yes [provider]  amitriptyline (ELAVIL) 10 MG tablet Take 10 mg by mouth at bedtime.   Yes [provider]  apixaban (ELIQUIS) 2.5 MG TABS tablet Take 1 tablet (2.5 mg total) by mouth 2 (two) times daily. 05/29/16  Yes Geradine Girt, DO  aspirin-acetaminophen-caffeine (EXCEDRIN MIGRAINE) [phone] MG tablet Take 1 tablet by mouth every 6 (six) hours as needed for headache.    Yes [provider]  Cholecalciferol (VITAMIN D-3 PO) Take 1 capsule by mouth daily.   Yes [provider]  diazepam (VALIUM) 5 MG tablet Take 2.5 mg by mouth at bedtime.    Yes [provider]  diltiazem (CARDIZEM CD) 120 MG 24 hr capsule Take 1 capsule (120 mg total) by mouth daily. 05/30/16  Yes Vann, Jessica U, DO  furosemide (LASIX) 40 MG tablet Take 1 tablet (40 mg total) by mouth  daily. 06/05/16  Yes Dunn, Dayna N, PA-C  glimepiride (AMARYL) 4 MG tablet Take 2 tablets (8 mg total) by mouth daily with breakfast. 05/29/16  Yes Vann, Jessica U, DO  HYDROcodone-acetaminophen (NORCO) 7.5-325 MG tablet Take 1 tablet by mouth See admin instructions. EVERY 8-12 HOURS AS NEEDED FOR PAIN 07/27/16  Yes [provider]  levothyroxine (SYNTHROID, LEVOTHROID) 50 MCG tablet Take 50 mcg by mouth daily before breakfast.    Yes [provider]  ondansetron (ZOFRAN) 4 MG tablet Take 4 mg by mouth 2 (two) times daily. 08/22/16  Yes [provider]  potassium chloride SA (KLOR-CON M20) 20 MEQ tablet Take 1 tablet (20 mEq total) by mouth daily. 06/05/16 09/03/16 Yes Dunn, Dayna N, PA-C  spironolactone (ALDACTONE) 25 MG tablet Take 1 tablet (25 mg total) by mouth daily. 05/30/16  Yes Vann, Jessica U, DO  tiotropium (SPIRIVA HANDIHALER) 18 MCG inhalation capsule Place 18 mcg into inhaler and inhale daily.    [provider]    Physical Exam: BP 119/81   Pulse (!) 144   Temp 97.7 F (36.5 C) (Oral)   Resp (!) 25   Ht 5\' 5"  (1.651 m)   Wt 90.7 kg (200 lb)   SpO2 93%   BMI 33.28 kg/m   General: Elderly Caucasian female. Awake and alert and oriented x3. No acute cardiopulmonary distress.  HEENT: Normocephalic atraumatic.  Right and left ears normal in appearance.  Pupils equal, round, reactive to light. Extraocular muscles are intact. Sclerae anicteric and noninjected.  Moist mucosal membranes. No mucosal lesions.  Neck: Neck supple without lymphadenopathy. No carotid bruits. No masses palpated.  Cardiovascular: Tachycardic rate. No murmurs, rubs, gallops auscultated. No JVD appreciated, however body habitus limits exam. She does have 2+ pitting edema in her lower extremities bilaterally to the knee.  Respiratory: Poor air movement. Rales in bases bilaterally, worse on left. Prolonged exhalation phase with tight bronchial sounds. Abdomen: Soft, nontender, nondistended. Active bowel sounds. No masses or hepatosplenomegaly  Skin: No rashes, lesions, or ulcerations.  Dry, warm to touch. 2+ dorsalis pedis and radial pulses. Musculoskeletal: No calf or leg pain. All major joints not erythematous nontender.  No upper or lower joint deformation.  Good ROM.  No contractures  Psychiatric: Intact judgment and insight. Pleasant and cooperative. Neurologic: No focal neurological deficits. Strength is 5/5  and symmetric in upper and lower extremities.  Cranial nerves II through XII are grossly intact.           Labs on Admission: I have personally reviewed following labs and imaging studies  CBC:  Recent Labs Lab 08/21/2016 1435  WBC 15.9*  HGB 11.6*  HCT 37.0  MCV 88.5  PLT 546*   Basic Metabolic Panel:  Recent Labs Lab 08/12/2016 1435  NA 130*  K 4.8  CL 91*  CO2 24  GLUCOSE 270*  BUN 47*  CREATININE 2.52*  CALCIUM 10.0   GFR: Estimated Creatinine Clearance: 19.5 mL/min (A) (by C-G formula based on SCr of 2.52 mg/dL (H)). Liver Function Tests: No results for input(s): AST, ALT, ALKPHOS, BILITOT, PROT, ALBUMIN in the last 168 hours. No results for input(s): LIPASE, AMYLASE in the last 168 hours. No results for input(s): AMMONIA in the last 168 hours. Coagulation Profile: No results for input(s): INR, PROTIME in the last 168 hours. Cardiac Enzymes: No results for input(s): CKTOTAL, CKMB, CKMBINDEX, TROPONINI in the last 168 hours. BNP (last 3 results)  Recent Labs  06/04/16 1014  PROBNP 400  HbA1C: No results for input(s): HGBA1C in the last 72 hours. CBG: No results for input(s): GLUCAP in the last 168 hours. Lipid Profile: No results for input(s): CHOL, HDL, LDLCALC, TRIG, CHOLHDL, LDLDIRECT in the last 72 hours. Thyroid Function Tests: No results for input(s): TSH, T4TOTAL, FREET4, T3FREE, THYROIDAB in the last 72 hours. Anemia Panel: No results for input(s): VITAMINB12, FOLATE, FERRITIN, TIBC, IRON, RETICCTPCT in the last 72 hours. Urine analysis: No results found for: COLORURINE, APPEARANCEUR, LABSPEC, PHURINE, GLUCOSEU, HGBUR, BILIRUBINUR, KETONESUR, PROTEINUR, UROBILINOGEN, NITRITE, LEUKOCYTESUR Sepsis Labs: @LABRCNTIP (procalcitonin:4,lacticidven:4) )No results found for this or any previous visit (from the past 240 hour(s)).   Radiological Exams on Admission: Dg Chest Portable 1 View  Result Date: 09/01/2016 CLINICAL DATA:  Shortness of breath,  respiratory distress, hypertension, diabetes mellitus, COPD, stage III chronic kidney disease, smoker EXAM: PORTABLE CHEST 1 VIEW COMPARISON:  Portable exam 1437 hours compared to 05/27/2016 FINDINGS: Enlargement of cardiac silhouette. Mediastinal contours and pulmonary vascularity normal. Atherosclerotic calcification aorta. Atelectasis versus infiltrate LEFT lower lobe. Remaining lungs clear. No pleural effusion or pneumothorax. Bones demineralized. IMPRESSION: Enlargement of cardiac silhouette. Atelectasis versus consolidation LEFT lower lobe. Aortic Atherosclerosis (ICD10-I70.0). Electronically Signed   By: Lavonia Dana M.D.   On: 08/06/2016 14:50    EKG: Independently reviewed. A. fib/A flutter.  Assessment/Plan: Principal Problem:   Acute on chronic respiratory failure with hypoxia (HCC) Active Problems:   Atrial fibrillation with RVR (HCC)   COPD (chronic obstructive pulmonary disease) (HCC)   Type 2 diabetes mellitus with retinopathy without macular edema, without long-term current use of insulin (HCC)   Hypothyroidism, acquired   Acute-on-chronic kidney injury ([place])   Acute systolic CHF (congestive heart failure) ([place])    This patient was discussed with the ED physician, including pertinent vitals, physical exam findings, labs, and imaging.  We also discussed care given by the ED provider.  #1 acute on chronic respiratory failure with hypoxia  I do not feel the patient is septic  Admit to stepdown  Continue oxygen support #2. Community Acquired pneumonia  Antibiotics: Ceftriaxone and azithromycin  Robitussin  Blood cultures drawn in the emergency department  Sputum cultures  CBC tomorrow  Strep antigen by urine #3 acute systolic CHF  Telemetry monitoring  Strict I/O  Daily Weights  Diuresis: Lasix 60 mg IV twice a day  Potassium: 20 mEq twice a day by mouth  Echo cardiac exam tomorrow  Repeat BMP tomorrow #4 COPD  Steroids: Solu-Medrol 125mg  followed by  60 twice a day #5 atrial fibrillation with RVR  Start Cardizem  Continue eliquis #6 type 2 diabetes  Continue home oral medications  Signed scale insulin with CBGs before meals and daily at bedtime #7 acute on chronic kidney injury Creatinine tomorrow   DVT prophylaxis: On eliquis Consultants: None Code Status: Full code Family Communication: Friend in the room  Disposition Plan: Patient should be able to return home following admission   Yavonne Kiss [place] [facility] Pager [phone]  If 7PM-7AM, please contact night-coverage [email or web address] Password [email or web address]

## 2016-08-25 NOTE — ED Triage Notes (Signed)
Pt here with increased SOB; pt O2 is 84% on 2 L from home; pt seen here recently for MI; pt sts chest tightness and palpitations

## 2016-08-25 NOTE — Progress Notes (Signed)
Pt admitted from ED per stretcher Alert/oriented CHG bath done and MRSA swab on 02 at 4 l n/c

## 2016-08-25 NOTE — ED Provider Notes (Signed)
MSE was initiated and I personally evaluated the patient and placed orders (if any) at  2:55 PM on August 25, 2016.  The patient appears stable so that the remainder of the MSE may be completed by another provider.    Kristy Mcdonald is a 83 y.o. female with a past medical history significant for atrial fibrillation with RVR on Eliquis, COPD, CKD baseline Cr 2.0, NIDDM, and hypothyroidism who presents with SOB, trouble sleeping x 3 days. Found to have elevated heart rate and hypoxia upon emergency department arrival. She reports difficulty lying flat. She has had some chest pressure.  ECHO 05/2016: Ejection fraction 50% with mild hypokinesis  Exam: Gen mild respiratory distress; Heart tachycardia, nml S1,S2, no m/r/g, mild JVD; Lungs diminished lung sounds bilaterally, especially at bases; Abd soft, NT, no rebound or guarding; Ext 2+ pedal pulses bilaterally, 1-2+ bilateral lower extremity edema.       Carlisle Cater, PA-C 08/31/2016 1455    Duffy Bruce, MD 08/26/16 1435

## 2016-08-25 NOTE — ED Provider Notes (Signed)
[facility] DEPT Provider Note   CSN: [id number] Arrival date & time: 09/01/2016  1416     History   Chief Complaint Chief Complaint  Patient presents with  . Shortness of Breath    HPI Kristy Mcdonald is a 83 y.o. female with history of atrial fibrillation with RVR on Eliquis, COPD, CKD baseline Cr 2.0, NIDDM, and hypothyroidism is coming in today with shortness of breath 1 week. Patient states it has gradually gotten worse. Also reports occasional chest pressure but currently denies chest pressure. Patient normally wears 2 L nasal cannula at home was found to be in the mid 80s on 2 L here. Placed on 4 L upon arrival with improvement of oxygen into the mid 90s. Patient denies any nausea, vomiting, abdominal pain. Does endorse occasional cough. Reports compliance with home medications.  HPI  Past Medical History:  Diagnosis Date  . Chronic respiratory failure ([place])   . CKD (chronic kidney disease), stage III   . COPD (chronic obstructive pulmonary disease) ([place])   . Diabetes mellitus ([place])   . Hypertension   . Hypokalemia   . Hypothyroidism   . Lower extremity edema   . PAF (paroxysmal atrial fibrillation) ([place])    a. dx 05/2016.  . Tobacco abuse     Patient Active Problem List   Diagnosis Date Noted  . Acute on chronic respiratory failure with hypoxia ([place]) 08/18/2016  . Acute-on-chronic kidney injury ([place]) 08/28/2016  . Acute systolic CHF (congestive heart failure) ([place]) 08/21/2016  . Community acquired pneumonia 09/01/2016  . Lower extremity edema 06/03/2016  . Atrial fibrillation with RVR ([place]) 05/27/2016  . Type 2 diabetes mellitus with retinopathy without macular edema, without long-term current use of insulin ([place]) 05/27/2016  . Hypokalemia 05/27/2016  . CKD (chronic kidney disease), stage IV ([place]) 05/27/2016  . Hyponatremia 05/27/2016  . Hypothyroidism, acquired 05/27/2016  . COPD (chronic obstructive pulmonary disease) ([place]) 01/10/2012    History  reviewed. No pertinent surgical history.  OB History    No data available       Home Medications    Prior to Admission medications   Medication Sig Start Date End Date Taking? Authorizing Provider  albuterol (PROVENTIL HFA;VENTOLIN HFA) 108 (90 BASE) MCG/ACT inhaler Inhale 2 puffs into the lungs every 6 (six) hours as needed for wheezing or shortness of breath.   Yes [provider]  amitriptyline (ELAVIL) 10 MG tablet Take 10 mg by mouth at bedtime.   Yes [provider]  apixaban (ELIQUIS) 2.5 MG TABS tablet Take 1 tablet (2.5 mg total) by mouth 2 (two) times daily. 05/29/16  Yes Geradine Girt, DO  aspirin-acetaminophen-caffeine (EXCEDRIN MIGRAINE) [phone] MG tablet Take 1 tablet by mouth every 6 (six) hours as needed for headache.    Yes [provider]  Cholecalciferol (VITAMIN D-3 PO) Take 1 capsule by mouth daily.   Yes [provider]  diazepam (VALIUM) 5 MG tablet Take 2.5 mg by mouth at bedtime.    Yes [provider]  diltiazem (CARDIZEM CD) 120 MG 24 hr capsule Take 1 capsule (120 mg total) by mouth daily. 05/30/16  Yes Vann, Jessica U, DO  furosemide (LASIX) 40 MG tablet Take 1 tablet (40 mg total) by mouth daily. 06/05/16  Yes Dunn, Dayna N, PA-C  glimepiride (AMARYL) 4 MG tablet Take 2 tablets (8 mg total) by mouth daily with breakfast. 05/29/16  Yes Vann, Jessica U, DO  HYDROcodone-acetaminophen (NORCO) 7.5-325 MG tablet Take 1 tablet by mouth See  admin instructions. EVERY 8-12 HOURS AS NEEDED FOR PAIN 07/27/16  Yes [provider]  levothyroxine (SYNTHROID, LEVOTHROID) 50 MCG tablet Take 50 mcg by mouth daily before breakfast.   Yes [provider]  ondansetron (ZOFRAN) 4 MG tablet Take 4 mg by mouth 2 (two) times daily. 08/22/16  Yes [provider]  potassium chloride SA (KLOR-CON M20) 20 MEQ tablet Take 1 tablet (20 mEq total) by mouth daily. 06/05/16 09/03/16 Yes Dunn, Dayna N, PA-C  spironolactone  (ALDACTONE) 25 MG tablet Take 1 tablet (25 mg total) by mouth daily. 05/30/16  Yes Vann, Jessica U, DO  tiotropium (SPIRIVA HANDIHALER) 18 MCG inhalation capsule Place 18 mcg into inhaler and inhale daily.    [provider]    Family History Family History  Problem Relation Age of Onset  . CAD Father   . Hypertension Father   . Hypertension Sister   . Dementia Mother     Social History Social History  Substance Use Topics  . Smoking status: Current Every Day Smoker  . Smokeless tobacco: Never Used  . Alcohol use No     Comment: Cut Back 2/3 %     Allergies   Lyrica [pregabalin]; Nicotine; and Prednisone   Review of Systems Review of Systems  Constitutional: Negative for chills and fever.  HENT: Negative for ear pain and sore throat.   Eyes: Negative for pain and visual disturbance.  Respiratory: Positive for cough, chest tightness and shortness of breath. Negative for wheezing and stridor.   Cardiovascular: Negative for palpitations.       Occasional pressure, asymptomatic now   Gastrointestinal: Negative for abdominal pain, constipation, diarrhea, nausea and vomiting.  Endocrine: Negative for polyuria.  Genitourinary: Negative for dysuria and hematuria.  Musculoskeletal: Negative for arthralgias and back pain.  Skin: Negative for color change and rash.  Neurological: Negative for seizures and syncope.  Psychiatric/Behavioral: Negative for agitation and behavioral problems.  All other systems reviewed and are negative.    Physical Exam Updated Vital Signs BP 119/81   Pulse (!) 144   Temp 97.7 F (36.5 C) (Oral)   Resp (!) 25   Ht 5\' 5"  (1.651 m)   Wt 90.7 kg (200 lb)   SpO2 93%   BMI 33.28 kg/m   Physical Exam  Constitutional: She is oriented to person, place, and time. She appears well-developed and well-nourished. No distress.  HENT:  Head: Normocephalic and atraumatic.  Eyes: Pupils are equal, round, and reactive to light. Conjunctivae and  EOM are normal.  Neck: Normal range of motion. Neck supple. No tracheal deviation present.  Cardiovascular: Exam reveals no friction rub.   No murmur heard. Tachycardia   Pulmonary/Chest: She has rales (bilateral lower bases with some decreased sounds to the LL lung).  Mild tachypnea improved with 4 L nasal cannula.  Abdominal: Soft. She exhibits no distension. There is no tenderness. There is no rebound and no guarding.  Musculoskeletal: She exhibits no edema or deformity.  Neurological: She is alert and oriented to person, place, and time.  Skin: Skin is warm and dry.  Psychiatric: She has a normal mood and affect.  Nursing note and vitals reviewed.    ED Treatments / Results  Labs (all labs ordered are listed, but only abnormal results are displayed) Labs Reviewed  BASIC METABOLIC PANEL - Abnormal; Notable for the following:       Result Value   Sodium 130 (*)    Chloride 91 (*)    Glucose,  Bld 270 (*)    BUN 47 (*)    Creatinine, Ser 2.52 (*)    GFR calc non Af Amer 17 (*)    GFR calc Af Amer 19 (*)    All other components within normal limits  CBC - Abnormal; Notable for the following:    WBC 15.9 (*)    Hemoglobin 11.6 (*)    Platelets 402 (*)    All other components within normal limits  BRAIN NATRIURETIC PEPTIDE - Abnormal; Notable for the following:    B Natriuretic Peptide 389.5 (*)    All other components within normal limits  CULTURE, BLOOD (ROUTINE X 2)  CULTURE, BLOOD (ROUTINE X 2)  URINALYSIS, ROUTINE W REFLEX MICROSCOPIC  I-STAT TROPONIN, ED  I-STAT CG4 LACTIC ACID, ED    EKG  EKG Interpretation None       Radiology Dg Chest Portable 1 View  Result Date: 08/29/2016 CLINICAL DATA:  Shortness of breath, respiratory distress, hypertension, diabetes mellitus, COPD, stage III chronic kidney disease, smoker EXAM: PORTABLE CHEST 1 VIEW COMPARISON:  Portable exam 1437 hours compared to 05/27/2016 FINDINGS: Enlargement of cardiac silhouette. Mediastinal  contours and pulmonary vascularity normal. Atherosclerotic calcification aorta. Atelectasis versus infiltrate LEFT lower lobe. Remaining lungs clear. No pleural effusion or pneumothorax. Bones demineralized. IMPRESSION: Enlargement of cardiac silhouette. Atelectasis versus consolidation LEFT lower lobe. Aortic Atherosclerosis (ICD10-I70.0). Electronically Signed   By: Lavonia Dana M.D.   On: 08/13/2016 14:50    Procedures Procedures (including critical care time)  Medications Ordered in ED Medications  diltiazem (CARDIZEM) 1 mg/mL load via infusion 20 mg (20 mg Intravenous Bolus from Bag 08/04/2016 1645)    And  diltiazem (CARDIZEM) 100 mg in dextrose 5 % 100 mL (1 mg/mL) infusion (5 mg/hr Intravenous New Bag/Given 08/19/2016 1658)  azithromycin (ZITHROMAX) 500 mg in dextrose 5 % 250 mL IVPB (not administered)  cefTRIAXone (ROCEPHIN) 1 g in dextrose 5 % 50 mL IVPB (0 g Intravenous Stopped 08/16/2016 1647)  sodium chloride 0.9 % bolus 1,000 mL (0 mLs Intravenous Stopped 08/20/2016 1655)     Initial Impression / Assessment and Plan / ED Course  I have reviewed the triage vital signs and the nursing notes.  Pertinent labs & imaging results that were available during my care of the patient were reviewed by me and considered in my medical decision making (see chart for details).     Patient presenting with increasing shortness of breath and dyspnea over the last week it has been worsening. Satting mid 80s on home oxygen requirement of 2 L. Tachycardic in mild respiratory distress. Placed on 4 L with improvement of oxygenation and tachypnea. Troponin 0.04, however she does have leukocytosis and an x-ray of the chest that is consistent with left lower lobe pneumonia. ED sepsis protocol started and patient will be given Fluids and started on Rocephin and doxycycline. Hospitalist consulted for further evaluation and management.  We'll reassess heart rate after fluid administration.  Patient received around 1 L  of fluids and heart rate is still in the 130s to 140s. We will stop fluids considering CHF history and start diltiazem for rate control. BNP peptide also elevated, could be potential for CHF exacerbation as well causing her symptoms. Heart rate decreased top the 90s-100s after administration of Diltiazem.  Patient will be admitted to the hospitalist for further management.  Patient was seen with my attending, Dr. Kathrynn Humble, who voiced agreement and oversaw the evaluation and treatment of this patient.   Network engineer  software was used in Field seismologist of this note. If there are any errors or inconsistencies needing clarification, please contact me directly.   Final Clinical Impressions(s) / ED Diagnoses   Final diagnoses:  Hypoxia  Shortness of breath    New Prescriptions New Prescriptions   No medications on file     Valda Lamb, MD 08/06/2016 Gantt, Ankit, MD 08/27/16 1801

## 2016-08-25 NOTE — ED Notes (Signed)
Pt wanting to get out of the bed. Pt informed that she is a FALL RISK and that she needs to remain in bed. Pt irate. Pt repositioned in the bed.

## 2016-08-26 ENCOUNTER — Encounter (HOSPITAL_COMMUNITY): Payer: Self-pay | Admitting: Physician Assistant

## 2016-08-26 ENCOUNTER — Inpatient Hospital Stay (HOSPITAL_COMMUNITY): Payer: Medicare Other

## 2016-08-26 DIAGNOSIS — N179 Acute kidney failure, unspecified: Secondary | ICD-10-CM

## 2016-08-26 DIAGNOSIS — I483 Typical atrial flutter: Secondary | ICD-10-CM

## 2016-08-26 DIAGNOSIS — N184 Chronic kidney disease, stage 4 (severe): Secondary | ICD-10-CM

## 2016-08-26 DIAGNOSIS — I5032 Chronic diastolic (congestive) heart failure: Secondary | ICD-10-CM

## 2016-08-26 DIAGNOSIS — J9621 Acute and chronic respiratory failure with hypoxia: Principal | ICD-10-CM

## 2016-08-26 LAB — BASIC METABOLIC PANEL
ANION GAP: 11 (ref 5–15)
BUN: 50 mg/dL — ABNORMAL HIGH (ref 6–20)
CALCIUM: 9 mg/dL (ref 8.9–10.3)
CO2: 23 mmol/L (ref 22–32)
Chloride: 94 mmol/L — ABNORMAL LOW (ref 101–111)
Creatinine, Ser: 2.55 mg/dL — ABNORMAL HIGH (ref 0.44–1.00)
GFR, EST AFRICAN AMERICAN: 19 mL/min — AB (ref >60)
GFR, EST NON AFRICAN AMERICAN: 17 mL/min — AB (ref >60)
Glucose, Bld: 230 mg/dL — ABNORMAL HIGH (ref 65–99)
Potassium: 5 mmol/L (ref 3.5–5.1)
Sodium: 128 mmol/L — ABNORMAL LOW (ref 135–145)

## 2016-08-26 LAB — CBC
HCT: 36.9 % (ref 36.0–46.0)
HEMOGLOBIN: 11.4 g/dL — AB (ref 12.0–15.0)
MCH: 27.5 pg (ref 26.0–34.0)
MCHC: 30.9 g/dL (ref 30.0–36.0)
MCV: 88.9 fL (ref 78.0–100.0)
PLATELETS: 340 10*3/uL (ref 150–400)
RBC: 4.15 MIL/uL (ref 3.87–5.11)
RDW: 13.3 % (ref 11.5–15.5)
WBC: 12.6 10*3/uL — ABNORMAL HIGH (ref 4.0–10.5)

## 2016-08-26 LAB — ECHOCARDIOGRAM COMPLETE
Height: 65 in
WEIGHTICAEL: 3326.3 [oz_av]

## 2016-08-26 LAB — GLUCOSE, CAPILLARY
GLUCOSE-CAPILLARY: 186 mg/dL — AB (ref 65–99)
Glucose-Capillary: 213 mg/dL — ABNORMAL HIGH (ref 65–99)
Glucose-Capillary: 202 mg/dL — ABNORMAL HIGH (ref 65–99)
Glucose-Capillary: 163 mg/dL — ABNORMAL HIGH (ref 65–99)

## 2016-08-26 MED ORDER — WHITE PETROLATUM GEL
Status: AC
  Administered 2016-08-26: 1
  Filled 2016-08-26: qty 1

## 2016-08-26 MED ORDER — MAGIC MOUTHWASH W/LIDOCAINE
5.0000 mL | Freq: Four times a day (QID) | ORAL | Status: DC | PRN
  Filled 2016-08-26: qty 5

## 2016-08-26 MED ORDER — SENNA 8.6 MG PO TABS
1.0000 | ORAL_TABLET | Freq: Every day | ORAL | Status: DC
  Administered 2016-08-26 – 2016-08-28 (×3): 8.6 mg via ORAL
  Filled 2016-08-26 (×4): qty 1

## 2016-08-26 MED ORDER — DILTIAZEM HCL 30 MG PO TABS
30.0000 mg | ORAL_TABLET | Freq: Four times a day (QID) | ORAL | Status: DC
  Administered 2016-08-26 – 2016-08-27 (×3): 30 mg via ORAL
  Filled 2016-08-26 (×3): qty 1

## 2016-08-26 NOTE — Progress Notes (Signed)
Patient ID: Kristy Mcdonald, female   DOB: 11/24/1932, 83 y.o.   MRN: [id number]  PROGRESS NOTE    Kristy Mcdonald  XVQ:[id number] DOB: 24-Oct-1932 DOA: 09/03/2016  PCP: Lucianne Lei, MD   Brief Narrative:  83 year old female with history of COPD, chronic systolic and diastolic CHF, CKD stage 4, hypothyroidism, atrial fibrillation on apixaban who presented to ED with worsening shortness of breath at rest and with exertion. Pt also had non productive cough. She was admitted to SDU for management of acute CHF exacerbation.    Assessment & Plan:   Principal Problem:   Acute on chronic respiratory failure with hypoxia (HCC) / Acute systolic and diastolic CHF - Cardiology consulted - BNP 389 - Continue lasix 60 mg IV Q 12 hours - Continue aldactone 25 mg daily  - Troponin WNL - ECHO pending  - Continue daily weight and strict intake and output   Active Problems:   Atrial fibrillation with RVR (HCC) - CHADS vasc score at least 5 - On AC with apixaban  - On Cardizem drip     Left lower lobe pneumonia - Possible consolidation in left lower lung lobe - Continue azithromycin and Rocephin  - Blood cx negative     Depression - Continue elavil  And valium     Diabetes mellitus with diabetic nephropathy - On SSI    Acute renal failure superimposed on CKD stage 4 - Cr. 2.16 in 04/2016 - Cr 2.5 on this admission, elevated likely due to lasix - Lasix on hold  - Follow up BMP in am    Hypothyroidism - Continue synthroid    COPD - Continue spiriva   DVT prophylaxis: Apixaban Code Status: full code  Family Communication: no family at the bedside  Disposition Plan: not yet stable for discharge    Consultants:   Cardio   Procedures:  None   Antimicrobials:   Azithro 7/22 -->  Rocephin 7/22 -->   Subjective: No overnight events.   Objective: Vitals:   08/26/16 0750 08/26/16 0800 08/26/16 0900 08/26/16 1154  BP: 120/61 122/62 (!) 115/58 (!) 120/57  Pulse:  81 76 76 75  Resp: (!) 24 (!) 21 (!) 21 (!) 31  Temp:    (!) 97.4 F (36.3 C)  TempSrc:    Oral  SpO2: 90% 93% 96% 95%  Weight:      Height:        Intake/Output Summary (Last 24 hours) at 08/26/16 1256 Last data filed at 08/26/16 1227  Gross per 24 hour  Intake          1627.71 ml  Output              225 ml  Net          1402.71 ml   Filed Weights   08/05/2016 1607 08/05/2016 1838 08/26/16 0500  Weight: 90.7 kg (200 lb) 98.8 kg (217 lb 13 oz) 94.3 kg (207 lb 14.3 oz)    Examination:  General exam: Appears calm, no distress  Respiratory system: diminished breath sounds, no wheezing  Cardiovascular system: S1 & S2 heard, Rate controlled  Gastrointestinal system: Abdomen is nondistended, soft and nontender. No organomegaly or masses felt. Normal bowel sounds heard. Central nervous system: little groggy this am otherwise non focal Extremities: leg swelling +1 , palpable pulses  Skin: No rashes, lesions or ulcers Psychiatry: Normal mood and behavior   Data Reviewed: I have personally reviewed following labs and imaging studies  CBC:  Recent Labs Lab 08/23/2016 1435 08/26/16 0211  WBC 15.9* 12.6*  HGB 11.6* 11.4*  HCT 37.0 36.9  MCV 88.5 88.9  PLT 402* 287   Basic Metabolic Panel:  Recent Labs Lab 08/16/2016 1435 08/26/16 0211  NA 130* 128*  K 4.8 5.0  CL 91* 94*  CO2 24 23  GLUCOSE 270* 230*  BUN 47* 50*  CREATININE 2.52* 2.55*  CALCIUM 10.0 9.0   GFR: Estimated Creatinine Clearance: 19.6 mL/min (A) (by C-G formula based on SCr of 2.55 mg/dL (H)). Liver Function Tests: No results for input(s): AST, ALT, ALKPHOS, BILITOT, PROT, ALBUMIN in the last 168 hours. No results for input(s): LIPASE, AMYLASE in the last 168 hours. No results for input(s): AMMONIA in the last 168 hours. Coagulation Profile: No results for input(s): INR, PROTIME in the last 168 hours. Cardiac Enzymes: No results for input(s): CKTOTAL, CKMB, CKMBINDEX, TROPONINI in the last 168  hours. BNP (last 3 results)  Recent Labs  06/04/16 1014  PROBNP 400   HbA1C: No results for input(s): HGBA1C in the last 72 hours. CBG:  Recent Labs Lab 08/13/2016 1847 08/19/2016 2101 08/26/16 1153  GLUCAP 278* 297* 202*   Lipid Profile: No results for input(s): CHOL, HDL, LDLCALC, TRIG, CHOLHDL, LDLDIRECT in the last 72 hours. Thyroid Function Tests: No results for input(s): TSH, T4TOTAL, FREET4, T3FREE, THYROIDAB in the last 72 hours. Anemia Panel: No results for input(s): VITAMINB12, FOLATE, FERRITIN, TIBC, IRON, RETICCTPCT in the last 72 hours. Urine analysis:    Component Value Date/Time   COLORURINE YELLOW 08/31/2016 2012   APPEARANCEUR CLEAR 08/19/2016 2012   LABSPEC 1.014 08/27/2016 2012   PHURINE 5.0 08/20/2016 2012   GLUCOSEU NEGATIVE 09/02/2016 2012   HGBUR NEGATIVE 08/27/2016 2012   [place] NEGATIVE 08/08/2016 2012   [place] NEGATIVE 08/28/2016 2012   PROTEINUR 30 (A) 08/23/2016 2012   NITRITE NEGATIVE 08/04/2016 2012   LEUKOCYTESUR NEGATIVE 08/24/2016 2012   Sepsis Labs: @LABRCNTIP (procalcitonin:4,lacticidven:4)   ) Recent Results (from the past 240 hour(s))  MRSA PCR Screening     Status: None   Collection Time: 08/19/2016  6:48 PM  Result Value Ref Range Status   MRSA by PCR NEGATIVE NEGATIVE Final      Radiology Studies: Dg Chest Portable 1 View Result Date: 08/12/2016 Enlargement of cardiac silhouette. Atelectasis versus consolidation LEFT lower lobe. Aortic Atherosclerosis (ICD10-I70.0).     Scheduled Meds: . amitriptyline  10 mg Oral QHS  . apixaban  2.5 mg Oral BID  . diazepam  2.5 mg Oral QHS  . furosemide  60 mg Intravenous Q12H  . glimepiride  8 mg Oral Q breakfast  . insulin aspart  0-15 Units Subcutaneous TID WC  . insulin aspart  0-5 Units Subcutaneous QHS  . levothyroxine  50 mcg Oral QAC breakfast  . mouth rinse  15 mL Mouth Rinse BID  . methylPREDNISolone (SOLU-MEDROL) injection  125 mg Intravenous Once   Followed by   . methylPREDNISolone (SOLU-MEDROL) injection  60 mg Intravenous Q12H  . potassium chloride SA  20 mEq Oral Daily  . senna  1 tablet Oral Daily  . sodium chloride flush  3 mL Intravenous Q12H  . spironolactone  25 mg Oral Daily  . tiotropium  18 mcg Inhalation Daily   Continuous Infusions: . sodium chloride    . azithromycin Stopped (08/31/2016 1849)  . cefTRIAXone (ROCEPHIN)  IV    . diltiazem (CARDIZEM) infusion 5 mg/hr (08/26/16 1024)     LOS: 1 day    Time  spent: 25 minutes Greater than 50% of the time spent on counseling and coordinating the care.   Leisa Lenz, MD [facility] Pager [phone]  If 7PM-7AM, please contact night-coverage [email or web address] Password [facility] 08/26/2016, 12:56 PM

## 2016-08-26 NOTE — Consult Note (Signed)
[facility] Renal Consultation Note  Requesting MD: Charlies Silvers Indication for Consultation: elevated crt, possible CHF  HPI:  Kristy Mcdonald is a 83 y.o. female with past medical history significant for COPD on home oxygen, hypothyroidism, atrial fibrillation.  She is status post a hospitalization in April of this year for A. fib with RVR which was a new diagnosis. She was placed on Cardizem, spironolactone, Lasix was increased but metolazone stopped for hypokalemia. Afterwards, she did well. There was a weight in May which was 194 pounds. She now presents with several weeks of worsening shortness of breath, orthopnea, dyspnea on exertion and weight gain- at least to 200. Initial diagnosis was felt to be a pneumonia, called code sepsis and given 1 L of fluid but was not continued due to BNP of 389. She was then placed on Lasix 60 mg IV twice a day and Cardizem was initiated. She has since converted to normal sinus rhythm. Creatinine is stable the last 12 hours but seemingly worse than it was 2 months ago.  Urine output has not been robust only 250 today- . Blood pressure yesterday evening got as low as 82/69.  Cardiology was consulted and wanted [place] on board.  Patient actually feels that her breathing is much improved at this time. Patient is aware that her kidneys are weak but does not follow with a nephrologist. Her primary care physician is Dr. Criss Rosales   Creatinine, Ser  Date/Time Value Ref Range Status  08/26/2016 02:11 AM 2.55 (H) 0.44 - 1.00 mg/dL Final  08/14/2016 02:35 PM 2.52 (H) 0.44 - 1.00 mg/dL Final  06/13/2016 03:36 PM 1.85 (H) 0.57 - 1.00 mg/dL Final  06/04/2016 10:14 AM 2.11 (H) 0.57 - 1.00 mg/dL Final  05/29/2016 07:43 AM 1.78 (H) 0.44 - 1.00 mg/dL Final  05/28/2016 04:29 AM 1.68 (H) 0.44 - 1.00 mg/dL Final  05/27/2016 07:44 PM 1.87 (H) 0.44 - 1.00 mg/dL Final  04/25/2016 05:38 PM 2.16 (H) 0.44 - 1.00 mg/dL Final     PMHx:   Past Medical History:  Diagnosis Date   . Chronic diastolic CHF (congestive heart failure) ([place])   . Chronic edema   . Chronic respiratory failure ([place])   . CKD (chronic kidney disease), stage IV ([place])   . COPD (chronic obstructive pulmonary disease) ([place])   . Diabetes mellitus ([place])   . Hypertension   . Hypokalemia   . Hypothyroidism   . Lower extremity edema   . PAF (paroxysmal atrial fibrillation) ([place])    a. dx 05/2016.  . Tobacco abuse     History reviewed. No pertinent surgical history.  Family Hx:  Family History  Problem Relation Age of Onset  . CAD Father   . Hypertension Father   . Hypertension Sister   . Dementia Mother     Social History:  reports that she has been smoking.  She has never used smokeless tobacco. She reports that she does not drink alcohol or use drugs.  Allergies:  Allergies  Allergen Reactions  . Lyrica [Pregabalin] Other (See Comments)    Causes fluid retention around both eyes  . Nicotine Diarrhea  . Prednisone Other (See Comments)    Causes shakiness and moodiness    Medications: Prior to Admission medications   Medication Sig Start Date End Date Taking? Authorizing Provider  albuterol (PROVENTIL HFA;VENTOLIN HFA) 108 (90 BASE) MCG/ACT inhaler Inhale 2 puffs into the lungs every 6 (six) hours as needed for wheezing or shortness of breath.   Yes [provider]  amitriptyline (ELAVIL) 10 MG tablet Take 10 mg by mouth at bedtime.   Yes [provider]  apixaban (ELIQUIS) 2.5 MG TABS tablet Take 1 tablet (2.5 mg total) by mouth 2 (two) times daily. 05/29/16  Yes Geradine Girt, DO  aspirin-acetaminophen-caffeine (EXCEDRIN MIGRAINE) [phone] MG tablet Take 1 tablet by mouth every 6 (six) hours as needed for headache.    Yes [provider]  Cholecalciferol (VITAMIN D-3 PO) Take 1 capsule by mouth daily.   Yes [provider]  diazepam (VALIUM) 5 MG tablet Take 2.5 mg by mouth at bedtime.    Yes [provider]  diltiazem (CARDIZEM  CD) 120 MG 24 hr capsule Take 1 capsule (120 mg total) by mouth daily. 05/30/16  Yes Vann, Jessica U, DO  furosemide (LASIX) 40 MG tablet Take 1 tablet (40 mg total) by mouth daily. 06/05/16  Yes Dunn, Dayna N, PA-C  glimepiride (AMARYL) 4 MG tablet Take 2 tablets (8 mg total) by mouth daily with breakfast. 05/29/16  Yes Vann, Jessica U, DO  HYDROcodone-acetaminophen (NORCO) 7.5-325 MG tablet Take 1 tablet by mouth See admin instructions. EVERY 8-12 HOURS AS NEEDED FOR PAIN 07/27/16  Yes [provider]  levothyroxine (SYNTHROID, LEVOTHROID) 50 MCG tablet Take 50 mcg by mouth daily before breakfast.   Yes [provider]  ondansetron (ZOFRAN) 4 MG tablet Take 4 mg by mouth 2 (two) times daily. 08/22/16  Yes [provider]  potassium chloride SA (KLOR-CON M20) 20 MEQ tablet Take 1 tablet (20 mEq total) by mouth daily. 06/05/16 09/03/16 Yes Dunn, Dayna N, PA-C  spironolactone (ALDACTONE) 25 MG tablet Take 1 tablet (25 mg total) by mouth daily. 05/30/16  Yes Vann, Jessica U, DO  tiotropium (SPIRIVA HANDIHALER) 18 MCG inhalation capsule Place 18 mcg into inhaler and inhale daily.    [provider]    I have reviewed the patient's current medications.  Labs:  Results for orders placed or performed during the hospital encounter of 08/24/2016 (from the past 48 hour(s))  Basic metabolic panel     Status: Abnormal   Collection Time: 08/15/2016  2:35 PM  Result Value Ref Range   Sodium 130 (L) 135 - 145 mmol/L   Potassium 4.8 3.5 - 5.1 mmol/L   Chloride 91 (L) 101 - 111 mmol/L   CO2 24 22 - 32 mmol/L   Glucose, Bld 270 (H) 65 - 99 mg/dL   BUN 47 (H) 6 - 20 mg/dL   Creatinine, Ser 2.52 (H) 0.44 - 1.00 mg/dL   Calcium 10.0 8.9 - 10.3 mg/dL   GFR calc non Af Amer 17 (L) >60 mL/min   GFR calc Af Amer 19 (L) >60 mL/min    Comment: (NOTE) The eGFR has been calculated using the CKD EPI equation. This calculation has not been validated in all clinical situations. eGFR's  persistently <60 mL/min signify possible Chronic Kidney Disease.    Anion gap 15 5 - 15  CBC     Status: Abnormal   Collection Time: 08/26/2016  2:35 PM  Result Value Ref Range   WBC 15.9 (H) 4.0 - 10.5 K/uL   RBC 4.18 3.87 - 5.11 MIL/uL   Hemoglobin 11.6 (L) 12.0 - 15.0 g/dL   HCT 37.0 36.0 - 46.0 %   MCV 88.5 78.0 - 100.0 fL   MCH 27.8 26.0 - 34.0 pg   MCHC 31.4 30.0 - 36.0 g/dL   RDW 13.2 11.5 - 15.5 %   Platelets  402 (H) 150 - 400 K/uL  Brain natriuretic peptide     Status: Abnormal   Collection Time: 08/10/2016  2:35 PM  Result Value Ref Range   B Natriuretic Peptide 389.5 (H) 0.0 - 100.0 pg/mL  I-stat troponin, ED     Status: None   Collection Time: 08/15/2016  2:48 PM  Result Value Ref Range   Troponin i, poc 0.04 0.00 - 0.08 ng/mL   Comment 3            Comment: Due to the release kinetics of cTnI, a negative result within the first hours of the onset of symptoms does not rule out myocardial infarction with certainty. If myocardial infarction is still suspected, repeat the test at appropriate intervals.   Blood Culture (routine x 2)     Status: None (Preliminary result)   Collection Time: 08/08/2016  4:08 PM  Result Value Ref Range   Specimen Description BLOOD LEFT ANTECUBITAL    Special Requests      BOTTLES DRAWN AEROBIC AND ANAEROBIC Blood Culture adequate volume   Culture NO GROWTH < 24 HOURS    Report Status PENDING   Blood Culture (routine x 2)     Status: None (Preliminary result)   Collection Time: 08/28/2016  4:12 PM  Result Value Ref Range   Specimen Description BLOOD LEFT FOREARM    Special Requests      BOTTLES DRAWN AEROBIC ONLY Blood Culture adequate volume   Culture NO GROWTH < 24 HOURS    Report Status PENDING   I-Stat CG4 Lactic Acid, ED  (not at  [facility])     Status: None   Collection Time: 08/14/2016  4:26 PM  Result Value Ref Range   Lactic Acid, Venous 1.84 0.5 - 1.9 mmol/L  Glucose, capillary     Status: Abnormal   Collection Time: 08/18/2016  6:47 PM   Result Value Ref Range   Glucose-Capillary 278 (H) 65 - 99 mg/dL  MRSA PCR Screening     Status: None   Collection Time: 08/16/2016  6:48 PM  Result Value Ref Range   MRSA by PCR NEGATIVE NEGATIVE    Comment:        The GeneXpert MRSA Assay (FDA approved for NASAL specimens only), is one component of a comprehensive MRSA colonization surveillance program. It is not intended to diagnose MRSA infection nor to guide or monitor treatment for MRSA infections.   Urinalysis, Routine w reflex microscopic     Status: Abnormal   Collection Time: 08/27/2016  8:12 PM  Result Value Ref Range   Color, Urine YELLOW YELLOW   APPearance CLEAR CLEAR   Specific Gravity, Urine 1.014 1.005 - 1.030   pH 5.0 5.0 - 8.0   Glucose, UA NEGATIVE NEGATIVE mg/dL   Hgb urine dipstick NEGATIVE NEGATIVE   Bilirubin Urine NEGATIVE NEGATIVE   Ketones, ur NEGATIVE NEGATIVE mg/dL   Protein, ur 30 (A) NEGATIVE mg/dL   Nitrite NEGATIVE NEGATIVE   Leukocytes, UA NEGATIVE NEGATIVE  Strep pneumoniae urinary antigen     Status: None   Collection Time: 08/12/2016  8:12 PM  Result Value Ref Range   Strep Pneumo Urinary Antigen NEGATIVE NEGATIVE    Comment:        Infection due to S. pneumoniae cannot be absolutely ruled out since the antigen present may be below the detection limit of the test.   Glucose, capillary     Status: Abnormal   Collection Time: 08/24/2016  9:01 PM  Result Value  Ref Range   Glucose-Capillary 297 (H) 65 - 99 mg/dL  Basic metabolic panel     Status: Abnormal   Collection Time: 08/26/16  2:11 AM  Result Value Ref Range   Sodium 128 (L) 135 - 145 mmol/L   Potassium 5.0 3.5 - 5.1 mmol/L   Chloride 94 (L) 101 - 111 mmol/L   CO2 23 22 - 32 mmol/L   Glucose, Bld 230 (H) 65 - 99 mg/dL   BUN 50 (H) 6 - 20 mg/dL   Creatinine, Ser 2.55 (H) 0.44 - 1.00 mg/dL   Calcium 9.0 8.9 - 10.3 mg/dL   GFR calc non Af Amer 17 (L) >60 mL/min   GFR calc Af Amer 19 (L) >60 mL/min    Comment: (NOTE) The eGFR  has been calculated using the CKD EPI equation. This calculation has not been validated in all clinical situations. eGFR's persistently <60 mL/min signify possible Chronic Kidney Disease.    Anion gap 11 5 - 15  CBC     Status: Abnormal   Collection Time: 08/26/16  2:11 AM  Result Value Ref Range   WBC 12.6 (H) 4.0 - 10.5 K/uL   RBC 4.15 3.87 - 5.11 MIL/uL   Hemoglobin 11.4 (L) 12.0 - 15.0 g/dL   HCT 36.9 36.0 - 46.0 %   MCV 88.9 78.0 - 100.0 fL   MCH 27.5 26.0 - 34.0 pg   MCHC 30.9 30.0 - 36.0 g/dL   RDW 13.3 11.5 - 15.5 %   Platelets 340 150 - 400 K/uL  Glucose, capillary     Status: Abnormal   Collection Time: 08/26/16  7:23 AM  Result Value Ref Range   Glucose-Capillary 213 (H) 65 - 99 mg/dL  Glucose, capillary     Status: Abnormal   Collection Time: 08/26/16 11:53 AM  Result Value Ref Range   Glucose-Capillary 202 (H) 65 - 99 mg/dL  Glucose, capillary     Status: Abnormal   Collection Time: 08/26/16  4:12 PM  Result Value Ref Range   Glucose-Capillary 163 (H) 65 - 99 mg/dL     ROS:  A comprehensive review of systems was negative except for: Ears, nose, mouth, throat, and face: positive for sore mouth Respiratory: positive for dyspnea on exertion and wheezing edema  Physical Exam: Vitals:   08/26/16 1613 08/26/16 1700  BP: 134/62 111/75  Pulse: 76 79  Resp: (!) 24 (!) 27  Temp: 97.8 F (36.6 C)      General: Elderly, moderately obese, currently sitting up on the bed side commode  HEENT: pupils are equally round and reactive, extraocular motions are intact, mucous membranes are a beefy red and she reports soreness  Neck: Difficult to determine JVD  Heart: regular rate and rhythm  Lungs: extremely poor air movement bilaterally  Abdomen: obese, soft, nontender  Extremities: p'eau d'orange appearance - pitting edema  Skin: warm and dry  Neuro: Alert, slightly confused   Assessment/Plan: 83 year old white female with significant COPD and recent diagnosis of A.  fib. She presents with shortness of breath 1.Renal- patient with baseline advanced stage III CKD at times CKD 4.  Kidney function appears worse than baseline but is stable over the last 12 hours, urinalysis pretty bland.  Urine output is lower than expected, I wonder if she had some ATN from low blood pressure yesterday in the setting of atrial fibrillation with RVR. She does appear slightly volume overloaded, but did not respond significantly to IV Lasix and is symptomatically improved so  I'm okay with holding further Lasix for now and hopefully allowing her kidneys to improve as renal perfusion improves with improved hemodynamics 2. Hypertension/volume  - does appear overloaded but not significantly at this time. No Lasix for now and continue to follow clinically no Lasix for now and continue to follow clinically 3. Shortness of breath- possibly more consistent with a COPD exacerbation versus intolerance to atrial fibrillation. Patient is symptomatically improved with treatment to date 4. Anemia  - not significant does not require treatment at this time  No changes for tonight. Will continue to follow patient. I hope that renal function will improve from insult  as patient would be a marginal chronic dialysis candidate   Alquan Morrish A 08/26/2016, 6:25 PM

## 2016-08-26 NOTE — Progress Notes (Signed)
    Inpatient Diabetes Program Recommendations  AACE/ADA: New Consensus Statement on Inpatient Glycemic Control (2015)  Target Ranges:  Prepandial:   less than 140 mg/dL      Peak postprandial:   less than 180 mg/dL (1-2 hours)      Critically ill patients:  140 - 180 mg/dL   Lab Results  Component Value Date   GLUCAP 297 (H) 08/24/2016    Review of Glycemic Control Results for Kristy Mcdonald, Kristy Mcdonald (MRN [id number]) as of 08/26/2016 09:55  Ref. Range 08/23/2016 18:47 08/19/2016 21:01  Glucose-Capillary Latest Ref Range: 65 - 99 mg/dL 278 (H) 297 (H)   Diabetes history: Type 2 diabetes Outpatient Diabetes medications: Amaryl 8 mg daily Current orders for Inpatient glycemic control:  Novolog moderate tid with meals and HS, Amaryl 8 mg daily Inpatient Diabetes Program Recommendations:   Consider d/c of Amaryl while patient is in the hospital.  Note that GFR<30 mg/min and therefore may consider not ordering at discharge as well.  While in the hospital, consider adding low dose basal insulin such as Lantus 10 units daily.   Thanks, Adah Perl, RN, BC-ADM Inpatient Diabetes Coordinator Pager [phone] (8a-5p)

## 2016-08-26 NOTE — Progress Notes (Signed)
  Echocardiogram 2D Echocardiogram has been performed.  Kristy Mcdonald 08/26/2016, 11:02 AM

## 2016-08-26 NOTE — Consult Note (Signed)
Cardiology Consultation:   Patient ID: Kristy Mcdonald; [id number]; 01-19-1933   Admit date: 08/23/2016 Date of Consult: 08/26/2016  Primary Care Provider: Lucianne Lei, MD Primary Cardiologist: Dr. Meda Coffee  Chief Complaint: shortness of breath  Patient Profile:   Kristy Mcdonald is a 83 y.o. female with a hx of CKD stage IV (baseline Cr 2), COPD with chronic respiratory failure on home O2, ongoing tobacco abuse, NIDDM, hypothyroidism, chronic LE redness/edema, PAF dx 05/2016 along with chronic diastolic CHF who is being seen today for the evaluation of SOB and atrial flutter at the request of Dr. Charlies Silvers.  History of Present Illness:   She was recently admitted 05/27/16 with palpitations, LE swelling and weakness. She had been treated for cellulitis as OP with home rocephin. Her EKG on arrival was suspicious for atrial fib RVR 158bpm and she converted to NSR/WAP on diltiazem. Based on history it appeared that she might have been in a-fib for several days (had felt her heart racing with walking). She was started on Eliquis. VQ was low probability for PE but she was found to require home O2 upon ambulation at discharge. She was not interested in quitting smoking completely but had cut down. Metolazone was stopped due to severely hypokalemia and mild hypermagnesemia. Her Lasix was titrated to 75m BID and spironolactone was added. 2D echo 05/29/16 showed EF 50%, diffuse HK, respirophasic variation of the interventricular septum was noted, may be related to significant COPD, RV mildly dilated but systolic function normal, dilated IVC suggesting elevated RV filling pressure. Discharge labs 4/25 showed K 3.7, Cr 1.78; other pertinent labs include BNP 129, TSH wnl, troponin wnl, Hgb 10.6. At f/u 06/04/16 she was doing well, Lasix cut back slightly due to further rise in creatinine.   She did relatively well initially but states for the last several weeks has had several weeks of worsening SOB,  orthonpea, DOE, weight gain. (OP weight 194lb in May, weights here recoded as 200->217->207). Per chart she was treated a month ago for COPD exacerbation with steroids and a Z-Pak. She continues to smoke. She's not had any chest pain. She was admitted and felt to have a LLL PNA with leukocytosis and elevated lactic acid. She was found to be tachycardic in what appears to be atrial flutter with RVR. She was initially felt to be a code sepsis and given 1 L fluid but not given subsequent IVF as she was not felt to be septic by admitting physician. She was placed on antibiotics, Lasix 617mIV BID, Solu Medrol, and initiated on IV Cardizem. She converted to NSR yesterday PM. Labs also reveal BNP 389, AKI with BUN/Cr 47/2.52, hyponatremia 130, troponin negative. Today she reports she feels "pretty good" but is feeling somewhat sleepy. She looks much less robust than when I met her in May. Weights inconsistent as above. Nurse reports minimal response to Lasix - 75cc UOP this morning and only 100cc this PM.    Past Medical History:  Diagnosis Date  . Chronic respiratory failure ([facility]  . CKD (chronic kidney disease), stage III   . COPD (chronic obstructive pulmonary disease) ([facility]  . Diabetes mellitus ([facility]  . Hypertension   . Hypokalemia   . Hypothyroidism   . Lower extremity edema   . PAF (paroxysmal atrial fibrillation) ([facility]   a. dx 05/2016.  . Tobacco abuse     History reviewed. No pertinent surgical history.   Inpatient Medications: Scheduled Meds: . amitriptyline  10 mg  Oral QHS  . apixaban  2.5 mg Oral BID  . diazepam  2.5 mg Oral QHS  . furosemide  60 mg Intravenous Q12H  . insulin aspart  0-15 Units Subcutaneous TID WC  . insulin aspart  0-5 Units Subcutaneous QHS  . levothyroxine  50 mcg Oral QAC breakfast  . mouth rinse  15 mL Mouth Rinse BID  . methylPREDNISolone (SOLU-MEDROL) injection  125 mg Intravenous Once   Followed by  . methylPREDNISolone (SOLU-MEDROL) injection  60 mg  Intravenous Q12H  . potassium chloride SA  20 mEq Oral Daily  . senna  1 tablet Oral Daily  . sodium chloride flush  3 mL Intravenous Q12H  . spironolactone  25 mg Oral Daily  . tiotropium  18 mcg Inhalation Daily   Continuous Infusions: . sodium chloride    . azithromycin Stopped (09/01/2016 1849)  . cefTRIAXone (ROCEPHIN)  IV    . diltiazem (CARDIZEM) infusion 5 mg/hr (08/26/16 1024)   PRN Meds: sodium chloride, acetaminophen, HYDROcodone-acetaminophen, ondansetron (ZOFRAN) IV, sodium chloride flush  Allergies:    Allergies  Allergen Reactions  . Lyrica [Pregabalin] Other (See Comments)    Causes fluid retention around both eyes  . Nicotine Diarrhea  . Prednisone Other (See Comments)    Causes shakiness and moodiness    Social History:   Social History   Social History  . Marital status: Married    Spouse name: N/A  . Number of children: N/A  . Years of education: N/A   Occupational History  . Not on file.   Social History Main Topics  . Smoking status: Current Every Day Smoker  . Smokeless tobacco: Never Used  . Alcohol use No     Comment: Cut Back 2/3 %  . Drug use: No  . Sexual activity: Not on file   Other Topics Concern  . Not on file   Social History Narrative  . No narrative on file    Family History:   The patient's family history includes CAD in her father; Dementia in her mother; Hypertension in her father and sister.  ROS:  Please see the history of present illness. All other ROS reviewed and negative.     Physical Exam/Data:   Vitals:   08/26/16 0750 08/26/16 0800 08/26/16 0900 08/26/16 1154  BP: 120/61 122/62 (!) 115/58 (!) 120/57  Pulse: 81 76 76 75  Resp: (!) 24 (!) 21 (!) 21 (!) 31  Temp:    (!) 97.4 F (36.3 C)  TempSrc:    Oral  SpO2: 90% 93% 96% 95%  Weight:      Height:        Intake/Output Summary (Last 24 hours) at 08/26/16 1336 Last data filed at 08/26/16 1227  Gross per 24 hour  Intake          1627.71 ml  Output               225 ml  Net          1402.71 ml   Filed Weights   08/28/2016 1607 08/20/2016 1838 08/26/16 0500  Weight: 200 lb (90.7 kg) 217 lb 13 oz (98.8 kg) 207 lb 14.3 oz (94.3 kg)   Body mass index is 34.6 kg/m.  General: Chronically ill appearing WF in no acute distress. Head: Normocephalic, atraumatic, sclera non-icteric, no xanthomas, nares are without discharge.  Neck: Negative for carotid bruits. JVD not elevated. Lungs: Diffusely diminished BS without obvious wheeze, rales or rhonchi. Breathing is unlabored. Heart:  RRR with S1 S2. No murmurs, rubs, or gallops appreciated. Abdomen: Soft, non-tender, non-distended with normoactive bowel sounds. No hepatomegaly. No rebound/guarding. No obvious abdominal masses. Msk:  Strength and tone appear normal for age. Extremities: No clubbing or cyanosis. Tr-1+ chronic-appearing LE edema which chronic skin thickening bilaterally.  Distal pedal pulses are 2+ and equal bilaterally. Neuro: Groggy but easily arousable and conversant, A+Ox3. No facial asymmetry. No focal deficit. Moves all extremities spontaneously. Psych:  Responds to questions appropriately with a normal affect.  EKG:  The EKG was personally reviewed and demonstrates atrial flutter 117bpm, rightward axis, low voltage QRS, cannot rule out prior anteroseptal infarct  Relevant CV Studies: 2D echo 08/26/16 Study Conclusions - Left ventricle: The cavity size was normal. Wall thickness was   increased in a pattern of mild LVH. Systolic function was normal.   The estimated ejection fraction was in the range of 60% to 65%.   Incoordinate septal motion. Doppler parameters are consistent   with abnormal left ventricular relaxation (grade 1 diastolic   dysfunction). The E/e&' ratio is between 8-15, suggesting   indeterminate LV filling pressure. - Mitral valve: Mildly thickened leaflets . There was trivial   regurgitation. - Left atrium: The atrium was normal in size. - Inferior vena cava:  The vessel was dilated. The respirophasic   diameter changes were blunted (< 50%), consistent with elevated   central venous pressure. Impressions: - LVEF 60-65%, mild LVH, incoordinate septal motion, grade 1 DD,   indeterminate LV filling pressure, trivial MR, normal LA size,   dilated IVC. Compared to a prior echo in 05/2016, the LVEF has   improved.    Laboratory Data:  Chemistry Recent Labs Lab 08/06/2016 1435 08/26/16 0211  NA 130* 128*  K 4.8 5.0  CL 91* 94*  CO2 24 23  GLUCOSE 270* 230*  BUN 47* 50*  CREATININE 2.52* 2.55*  CALCIUM 10.0 9.0  GFRNONAA 17* 17*  GFRAA 19* 19*  ANIONGAP 15 11     Hematology Recent Labs Lab 08/09/2016 1435 08/26/16 0211  WBC 15.9* 12.6*  RBC 4.18 4.15  HGB 11.6* 11.4*  HCT 37.0 36.9  MCV 88.5 88.9  MCH 27.8 27.5  MCHC 31.4 30.9  RDW 13.2 13.3  PLT 402* 340   Cardiac Enzymes  Recent Labs Lab 08/27/2016 1448  TROPIPOC 0.04    BNP Recent Labs Lab 08/07/2016 1435  BNP 389.5*   Lipid Panel  No results found for: CHOL, TRIG, HDL, CHOLHDL, VLDL, LDLCALC, LDLDIRECT   Radiology/Studies:  Dg Chest Portable 1 View  Result Date: 08/28/2016 CLINICAL DATA:  Shortness of breath, respiratory distress, hypertension, diabetes mellitus, COPD, stage III chronic kidney disease, smoker EXAM: PORTABLE CHEST 1 VIEW COMPARISON:  Portable exam 1437 hours compared to 05/27/2016 FINDINGS: Enlargement of cardiac silhouette. Mediastinal contours and pulmonary vascularity normal. Atherosclerotic calcification aorta. Atelectasis versus infiltrate LEFT lower lobe. Remaining lungs clear. No pleural effusion or pneumothorax. Bones demineralized. IMPRESSION: Enlargement of cardiac silhouette. Atelectasis versus consolidation LEFT lower lobe. Aortic Atherosclerosis (ICD10-I70.0). Electronically Signed   By: Lavonia Dana M.D.   On: 08/28/2016 14:50    Assessment and Plan:   1. Acute on chronic respiratory failure, with CAP, atrial flutter, CHF, likely  worsening of underlying COPD - suspect multifactorial, IM managing noncardiac issues.  2. Paroxysmal atrial fib, with newly recognized atrial flutter this admission - suspect precipitated by acute respiratory illness but cannot exclude potential contribution as well. Will discuss transition back to oral diltiazem with MD. Would hope  to avoid BB given her chronic lung disease. Will also need to consider changing NOAC if renal function worsens.  3. Acute on chronic diastolic CHF - will review diuretic regimen with MD in light of her poor UOP and AKI on CKD stage IV. Weights inconsistent in Epic but overall higher than prior office weight. 2D Echo with improved LVEF but elevated CVP.  4. AKI on CKD stage IV - hold spironolactone due to risk of hyperkalemia given her rising creatinine. D/c potassium given K of 5.0. Avoid nephroxic agents. Would strongly consider renal consult - spoke with nurse who will be relaying rec to primary MD.  5. Tobacco abuse - counseled on utmost importance of complete abstinence.  Signed, Charlie Pitter, PA-C  08/26/2016 1:36 PM   Patient seen and examined. Agree with assessment and plan.  Ms. Hansman is a 83 year old female who has a history of COPD with chronic respiratory failure on home O2, tobacco use, type 2 diabetes mellitus, hypothyroidism, and stage IV chronic kidney disease.  She has a history of paroxysmal atrial fibrillation and has been on eliquis for anticoagulation.  In April 2018.  Echo showed an EF of 50% with diffuse hypokinesis, mild RV dilation with normal function and a dilated IVC, suggesting elevated RV filling pressure.  She has had several weeks of worsening shortness of breath with her weights having significant variability ranging from 200-217 pounds.  She also has undergone recent treatment with steroids and Z-Pak for COPD exacerbation.  She was admitted with acute on chronic respiratory failure.  Her ECG reveals atrial flutter with RVR.  Due to  concerns for initial code sepsis.  She had been given a liter of fluid and subtotal.  He was placed on furosemide 60 mg twice a day in addition to Solu-Medrol and IV Cardizem.  She has converted to sinus rhythm and her ECG today, independently reviewed by me shows normal sinus rhythm at 77 bpm with nonspecific T changes.  QTC is 461 ms.  She is not having any chest pain.  Her creatinine has now risen to 2.55.  Serum sodium is 128.  .Agree with the physical examination as noted above.  Her echo Doppler study done today shows mild LVH with normal systolic function with an EF of 60-65%.  There is incoordinate septal motion.  There is grade 1 diastolic dysfunction.  She had blunted her spiral phasic diameter changes of her IVC with dilation consistent with elevated central venous pressure.  At present, will transition from IV Cardizem to regular strength Cardizem 30 mg every 6 hours initially.  She is on eliquis at reduced dose, but if renal function worsens.  This may need to be discontinued.  Would recommend nephrology evaluation.  Discontinue potassium and at present will hold Lasix dose this evening.  May need to hold spironolactone if renal function worsens.  Follow-up BNP.   Kristy Sine, MD, [facility] 08/26/2016 2:59 PM

## 2016-08-27 DIAGNOSIS — J449 Chronic obstructive pulmonary disease, unspecified: Secondary | ICD-10-CM

## 2016-08-27 DIAGNOSIS — J181 Lobar pneumonia, unspecified organism: Secondary | ICD-10-CM

## 2016-08-27 LAB — CBC
HEMATOCRIT: 35 % — AB (ref 36.0–46.0)
Hemoglobin: 11 g/dL — ABNORMAL LOW (ref 12.0–15.0)
MCH: 28 pg (ref 26.0–34.0)
MCHC: 31.4 g/dL (ref 30.0–36.0)
MCV: 89.1 fL (ref 78.0–100.0)
PLATELETS: 342 10*3/uL (ref 150–400)
RBC: 3.93 MIL/uL (ref 3.87–5.11)
RDW: 13.3 % (ref 11.5–15.5)
WBC: 12.9 10*3/uL — AB (ref 4.0–10.5)

## 2016-08-27 LAB — BASIC METABOLIC PANEL
ANION GAP: 13 (ref 5–15)
BUN: 59 mg/dL — ABNORMAL HIGH (ref 6–20)
CALCIUM: 9.1 mg/dL (ref 8.9–10.3)
CHLORIDE: 92 mmol/L — AB (ref 101–111)
CO2: 23 mmol/L (ref 22–32)
Creatinine, Ser: 2.99 mg/dL — ABNORMAL HIGH (ref 0.44–1.00)
GFR calc non Af Amer: 14 mL/min — ABNORMAL LOW (ref >60)
GFR, EST AFRICAN AMERICAN: 16 mL/min — AB (ref >60)
Glucose, Bld: 209 mg/dL — ABNORMAL HIGH (ref 65–99)
Potassium: 5.5 mmol/L — ABNORMAL HIGH (ref 3.5–5.1)
Sodium: 128 mmol/L — ABNORMAL LOW (ref 135–145)

## 2016-08-27 LAB — GLUCOSE, CAPILLARY
GLUCOSE-CAPILLARY: 235 mg/dL — AB (ref 65–99)
Glucose-Capillary: 218 mg/dL — ABNORMAL HIGH (ref 65–99)
Glucose-Capillary: 246 mg/dL — ABNORMAL HIGH (ref 65–99)
Glucose-Capillary: 315 mg/dL — ABNORMAL HIGH (ref 65–99)

## 2016-08-27 LAB — BRAIN NATRIURETIC PEPTIDE: B NATRIURETIC PEPTIDE 5: 174.8 pg/mL — AB (ref 0.0–100.0)

## 2016-08-27 MED ORDER — SODIUM POLYSTYRENE SULFONATE 15 GM/60ML PO SUSP
30.0000 g | Freq: Once | ORAL | Status: AC
  Administered 2016-08-27: 30 g via ORAL
  Filled 2016-08-27: qty 120

## 2016-08-27 MED ORDER — METHYLPREDNISOLONE SODIUM SUCC 125 MG IJ SOLR
60.0000 mg | Freq: Every day | INTRAMUSCULAR | Status: DC
  Administered 2016-08-28: 60 mg via INTRAVENOUS
  Filled 2016-08-27: qty 2

## 2016-08-27 MED ORDER — DILTIAZEM HCL 60 MG PO TABS
60.0000 mg | ORAL_TABLET | Freq: Four times a day (QID) | ORAL | Status: DC
  Administered 2016-08-27 – 2016-08-29 (×7): 60 mg via ORAL
  Filled 2016-08-27 (×8): qty 1

## 2016-08-27 MED ORDER — INSULIN GLARGINE 100 UNIT/ML ~~LOC~~ SOLN
10.0000 [IU] | Freq: Every day | SUBCUTANEOUS | Status: DC
  Administered 2016-08-27 – 2016-08-29 (×3): 10 [IU] via SUBCUTANEOUS
  Filled 2016-08-27 (×3): qty 0.1

## 2016-08-27 MED ORDER — BISOPROLOL FUMARATE 5 MG PO TABS
5.0000 mg | ORAL_TABLET | Freq: Every day | ORAL | Status: DC
  Administered 2016-08-27 – 2016-08-28 (×2): 5 mg via ORAL
  Filled 2016-08-27 (×2): qty 1

## 2016-08-27 NOTE — Progress Notes (Signed)
Admit: 08/15/2016 LOS: 2  83 yo F with CKD III-IV at baseline who presented with worsening SOB. Admitted with concern for LLL PNA, acute systolic CHF, hypoxic respiratory failure, atrial fibrillation with RVR, and AKI. She was started antibiotics, IV lasix, and spironolactone. Cardiology was consulted for rapid atrial flutter and cardizem was initiated. Unfortunately UOP declined and renal function has failed to improve. Nephrology consulted for further management.  Subjective:  Sleeping comfortably, husband at bedside. No concerns.   07/23 0701 - 07/24 0700 In: 621.9 [P.O.:250; I.V.:71.9; IV Piggyback:300] Out: 350 [Urine:350]  Filed Weights   08/23/2016 1838 08/26/16 0500 08/27/16 0405  Weight: 217 lb 13 oz (98.8 kg) 207 lb 14.3 oz (94.3 kg) 205 lb 4 oz (93.1 kg)    Scheduled Meds: . amitriptyline  10 mg Oral QHS  . apixaban  2.5 mg Oral BID  . bisoprolol  5 mg Oral Daily  . diazepam  2.5 mg Oral QHS  . diltiazem  60 mg Oral Q6H  . insulin aspart  0-15 Units Subcutaneous TID WC  . insulin aspart  0-5 Units Subcutaneous QHS  . levothyroxine  50 mcg Oral QAC breakfast  . mouth rinse  15 mL Mouth Rinse BID  . methylPREDNISolone (SOLU-MEDROL) injection  125 mg Intravenous Once   Followed by  . methylPREDNISolone (SOLU-MEDROL) injection  60 mg Intravenous Q12H  . senna  1 tablet Oral Daily  . sodium chloride flush  3 mL Intravenous Q12H  . tiotropium  18 mcg Inhalation Daily   Continuous Infusions: . sodium chloride    . azithromycin Stopped (08/26/16 1719)  . cefTRIAXone (ROCEPHIN)  IV Stopped (08/26/16 1514)   PRN Meds:.sodium chloride, acetaminophen, HYDROcodone-acetaminophen, magic mouthwash w/lidocaine, ondansetron (ZOFRAN) IV, sodium chloride flush  Current Labs: reviewed    Physical Exam:  Blood pressure 118/62, pulse (!) 117, temperature 97.8 F (36.6 C), temperature source Oral, resp. rate (!) 22, height 5\' 5"  (1.651 m), weight 205 lb 4 oz (93.1 kg), SpO2 96 %. Gen:  Sleeping comfortably, NAD Heart: RRR Lungs: Breathing comfortably, laying flat Ext: Bilateral lower extremity edema  Skin: no rash   A 1. Acute on Chronic Renal Dysfunction: Baseline CKD III - IV (Scr ~2.0) now with oliguria and Scr up to 2.99 today. Possible ATN from hypotension with afib w/ RVR, now back in sinus rhythm. UA is unremarkable. Lasix held today.  2. Hyperkalemia: Holding spironolactone and lasix.  3. Hyponatremia: In the setting of acute renal failure/reduced GFR and acute dCHF. Likely hypotonic, hypervolemic.  4. Acute on Chronic Respiratory Failure: Multifactorial due to CAP, acute dCHF exacerbation, and underlying COPD. 5. Paroxsymal a-fib/a-flutter: Card followings, transitioned from IV cardizem to PO. Spontaneously converted to sinus rhythm - no role for DCCV at this time. On reduced dose Eliquis, however if renal function continues to decline, will require transition to warfarin.   P 1. Continue to monitor renal function and volume status closely. Continue to hold lasix. Will follow up AM labs. Strict I/Os, daily weights, avoid nephrotoxic agents.  2. Kayexalate 30 g x 1 ordered.  3. Fluid restriction added.  4. Other issues per primary and cards.    Velna Ochs, M.D. - PGY2 Pager: [phone] 08/27/2016, 2:32 PM    Recent Labs Lab 08/12/2016 1435 08/26/16 0211 08/27/16 0231  NA 130* 128* 128*  K 4.8 5.0 5.5*  CL 91* 94* 92*  CO2 24 23 23   GLUCOSE 270* 230* 209*  BUN 47* 50* 59*  CREATININE 2.52* 2.55* 2.99*  CALCIUM  10.0 9.0 9.1    Recent Labs Lab 08/23/2016 1435 08/26/16 0211 08/27/16 0231  WBC 15.9* 12.6* 12.9*  HGB 11.6* 11.4* 11.0*  HCT 37.0 36.9 35.0*  MCV 88.5 88.9 89.1  PLT 402* 340 342

## 2016-08-27 NOTE — Progress Notes (Signed)
Inpatient Diabetes Program Recommendations  AACE/ADA: New Consensus Statement on Inpatient Glycemic Control (2015)  Target Ranges:  Prepandial:   less than 140 mg/dL      Peak postprandial:   less than 180 mg/dL (1-2 hours)      Critically ill patients:  140 - 180 mg/dL   Lab Results  Component Value Date   GLUCAP 246 (H) 08/27/2016    Review of Glycemic ControlResults for [name], [name] (MRN [id number]) as of 08/27/2016 14:33  Ref. Range 08/26/2016 11:53 08/26/2016 16:12 08/26/2016 21:20 08/27/2016 07:32 08/27/2016 11:59  Glucose-Capillary Latest Ref Range: 65 - 99 mg/dL 202 (H) 163 (H) 186 (H) 218 (H) 246 (H)   Diabetes history: Type 2 diabetes Outpatient Diabetes medications: Amaryl 8 mg daily Current orders for Inpatient glycemic control:  Novolog moderate tid with meals and HS, Solumedrol 60 mg IV q 6 hours  Inpatient Diabetes Program Recommendations:    While in the hospital, consider adding low dose basal insulin such as Lantus 10 units daily.   Thanks, Adah Perl, RN, BC-ADM Inpatient Diabetes Coordinator Pager [phone] (8a-5p)

## 2016-08-27 NOTE — Plan of Care (Signed)
Problem: Physical Regulation: Goal: Ability to maintain clinical measurements within normal limits will improve Outcome: Progressing Patient is having increased congested coughing. Patient took oxygen tubing off several times at night and it took a while for Sats to return to normal. Patient will be monitored for this and oxygen also drops at night d/t patient's tendency to breathe thru her mouth instead of with her nose. Patient's orientation varies at night when she seems confused d/t being tired. Questionably having some hospital delirium as well.

## 2016-08-27 NOTE — Progress Notes (Signed)
Progress Note  Patient Name: Kristy Mcdonald Date of Encounter: 08/27/2016  Primary Cardiologist: Liane Comber, MD   Subjective   Very sleepy this morning/afternoon.  Awakens easily.  Denies c/p.  Breathing slightly better.  Went back into Aflutter last night - she says she doesn't remember it happening - currently asymptomatic.  Rates ~ 110.  Inpatient Medications    Scheduled Meds: . amitriptyline  10 mg Oral QHS  . apixaban  2.5 mg Oral BID  . diazepam  2.5 mg Oral QHS  . diltiazem  30 mg Oral Q6H  . insulin aspart  0-15 Units Subcutaneous TID WC  . insulin aspart  0-5 Units Subcutaneous QHS  . levothyroxine  50 mcg Oral QAC breakfast  . mouth rinse  15 mL Mouth Rinse BID  . methylPREDNISolone (SOLU-MEDROL) injection  125 mg Intravenous Once   Followed by  . methylPREDNISolone (SOLU-MEDROL) injection  60 mg Intravenous Q12H  . senna  1 tablet Oral Daily  . sodium chloride flush  3 mL Intravenous Q12H  . tiotropium  18 mcg Inhalation Daily   Continuous Infusions: . sodium chloride    . azithromycin Stopped (08/26/16 1719)  . cefTRIAXone (ROCEPHIN)  IV Stopped (08/26/16 1514)   PRN Meds: sodium chloride, acetaminophen, HYDROcodone-acetaminophen, magic mouthwash w/lidocaine, ondansetron (ZOFRAN) IV, sodium chloride flush   Vital Signs    Vitals:   08/27/16 0700 08/27/16 0725 08/27/16 0733 08/27/16 0800  BP: 130/73  130/73 119/69  Pulse: 94  95 (!) 119  Resp: (!) 21  20 (!) 22  Temp:   (!) 97.4 F (36.3 C)   TempSrc:   Oral   SpO2: 95% (!) 88%  92%  Weight:      Height:        Intake/Output Summary (Last 24 hours) at 08/27/16 1147 Last data filed at 08/27/16 1010  Gross per 24 hour  Intake           574.92 ml  Output              350 ml  Net           224.92 ml   Filed Weights   08/07/2016 1838 08/26/16 0500 08/27/16 0405  Weight: 217 lb 13 oz (98.8 kg) 207 lb 14.3 oz (94.3 kg) 205 lb 4 oz (93.1 kg)    Physical Exam   GEN: Well nourished, well  developed, in no acute distress.  HEENT: Grossly normal.  Neck: Supple, obese, difficult to gauge jvp, no carotid bruits, or masses. Cardiac: Irreg, tachy, no murmurs, rubs, or gallops. No clubbing, cyanosis, trace woody edema.  Radials/DP/PT 1+ and equal bilaterally.  Respiratory:  Respirations regular and unlabored, diminished breath sounds bilaterally. GI: Soft, nontender, nondistended, BS + x 4. MS: no deformity or atrophy. Skin: warm and dry, no rash. Neuro:  Strength and sensation are intact. Psych: Groggy.  AAOx3 once awakened.  Flat affect.  Labs    Chemistry Recent Labs Lab 08/18/2016 1435 08/26/16 0211 08/27/16 0231  NA 130* 128* 128*  K 4.8 5.0 5.5*  CL 91* 94* 92*  CO2 24 23 23   GLUCOSE 270* 230* 209*  BUN 47* 50* 59*  CREATININE 2.52* 2.55* 2.99*  CALCIUM 10.0 9.0 9.1  GFRNONAA 17* 17* 14*  GFRAA 19* 19* 16*  ANIONGAP 15 11 13      Hematology Recent Labs Lab 08/06/2016 1435 08/26/16 0211 08/27/16 0231  WBC 15.9* 12.6* 12.9*  RBC 4.18 4.15 3.93  HGB 11.6* 11.4* 11.0*  HCT 37.0 36.9 35.0*  MCV 88.5 88.9 89.1  MCH 27.8 27.5 28.0  MCHC 31.4 30.9 31.4  RDW 13.2 13.3 13.3  PLT 402* 340 342    Cardiac Enzymes  Recent Labs Lab 08/24/2016 1448  TROPIPOC 0.04     BNP Recent Labs Lab 08/06/2016 1435 08/27/16 0231  BNP 389.5* 174.8*     Radiology    Dg Chest Portable 1 View  Result Date: 08/27/2016 CLINICAL DATA:  Shortness of breath, respiratory distress, hypertension, diabetes mellitus, COPD, stage III chronic kidney disease, smoker EXAM: PORTABLE CHEST 1 VIEW COMPARISON:  Portable exam 1437 hours compared to 05/27/2016 FINDINGS: Enlargement of cardiac silhouette. Mediastinal contours and pulmonary vascularity normal. Atherosclerotic calcification aorta. Atelectasis versus infiltrate LEFT lower lobe. Remaining lungs clear. No pleural effusion or pneumothorax. Bones demineralized. IMPRESSION: Enlargement of cardiac silhouette. Atelectasis versus  consolidation LEFT lower lobe. Aortic Atherosclerosis (ICD10-I70.0). Electronically Signed   By: Lavonia Dana M.D.   On: 08/29/2016 14:50    Telemetry    Converted back to atrial flutter @ 22:57 last night (7/23).  Currently trending around 110-115 - Personally Reviewed  Cardiac Studies   2D echo 08/26/16 Study Conclusions  - Left ventricle: The cavity size was normal. Wall thickness was increased in a pattern of mild LVH. Systolic function was normal. The estimated ejection fraction was in the range of 60% to 65%. Incoordinate septal motion. Doppler parameters are consistent with abnormal left ventricular relaxation (grade 1 diastolic dysfunction). The E/e&' ratio is between 8-15, suggesting indeterminate LV filling pressure. - Mitral valve: Mildly thickened leaflets . There was trivial regurgitation. - Left atrium: The atrium was normal in size. - Inferior vena cava: The vessel was dilated. The respirophasic diameter changes were blunted (<50%), consistent with elevated central venous pressure. Impressions: - LVEF 60-65%, mild LVH, incoordinate septal motion, grade 1 DD, indeterminate LV filling pressure, trivial MR, normal LA size, dilated IVC. Compared to a prior echo in 05/2016, the LVEF has improved.  Patient Profile     83 y.o. female with a h/o COPD, chronic resp failure on home O2, tob abuse, DM 2, CKD IV, hypothyroidism, and PAF who was admitted with resp failure and rapid Aflutter.  Assessment & Plan    1.  Acute on chronic resp failure/CAP/AECOPD: She says breathing somewhat better.  Diminished breath sounds, no wheezing.  Abx, steroids, inhalers per IM.  2.  PAF RVR: Pt presented with rapid aflutter in the setting of #1.  She initially converted but went back into Aflutter last night.  Rates currently 1-teens.  She is asymptomatic.  I will titrate dilt to 60 q6h.  Suspect atrial arrhythmias will cont to be an issue in setting of acute resp  illness.  May need to consider antiarrhythmic therapy.  No role for DCCV @ this ponit.  Cont eliquis @ reduced dose for now.  If renal fxn worsens, with drop of CrCl <15, would need to transition to warfarin.  3.  Acute on chronic diastolic CHF:  EF 59-56% 3/87/5643. +1.7L since admission.  Wt actually down if accurate.  Not significantly volume overloaded on exam.  BNP only mildly elevated @ 174.8 in setting of renal failure.  Follow for now.  4.  AKI on CKD IV:  Diuretics on hold.  Creat sl worse this am.  Nephrology following.  5.  Tob Abuse:  Cessation advised.  6.  Hyperkalemia: mild - in setting of worsening renal fxn.  Mgmt per IM.  Signed, Murray Hodgkins, NP  08/27/2016, 11:47 AM     Patient seen and examined. Agree with assessment and plan. No chest pain. Reverted back to atrial flutter; HR now 105 - 110. Will increase diltiazem to 60 mg q 6 hrs. Will add low dose bisoprolol at 5 mg with COPD. Cr increased to 2.99 today approaching/borderline stage 5 CKD. K 5.5. BNP 174. Now off lasix, supplemental KCL, and spironolactone. Renal following as well.    Troy Sine, MD, [facility] 08/27/2016 1:49 PM

## 2016-08-27 NOTE — Care Management Note (Addendum)
Case Management Note  Patient Details  Name: Kristy Mcdonald MRN: [id number] Date of Birth: December 09, 1932  Subjective/Objective:    From home with spouse and daughter , [name].  She is active with [facility], [place].  She states if she needs HH services she would like to continue with [facility].  She has home oxygen with [facility] (2L), she has a w/chair, shower chair and glucometer.  She presents with CHF ex , Resp Failure. She also was taking eliquis at home.   7/25 [place], BSN- conts on HFNC 15 liters , fluid restriction 1.5L.   Per pt eval rec SNF, CSW referral.      7/26 [place] RN, BSN - acute /chronic resp failur,aflutter, acute on chronic chf, aki on chronic kidney dz stage 4, hyperkalemia  combative during the night, on tele she is aflutter  , conts on 15L HFNC,, will cont eliquis, per Cards she will need CCM consult and intubation, will need to decide if she is a candidate for dialysis.           Action/Plan: NCM will follow for dc needs.   Expected Discharge Date:                  Expected Discharge Plan:  [place]  In-House Referral:     Discharge planning Services  CM Consult  Post Acute Care Choice:  Resumption of Svcs/PTA Provider Choice offered to:     DME Arranged:    DME Agency:     HH Arranged:    [place] Agency:     Status of Service:  In process, will continue to follow  If discussed at Long Length of Stay Meetings, dates discussed:    Additional Comments:  Zenon Mayo, RN 08/27/2016, 2:54 PM

## 2016-08-27 NOTE — Progress Notes (Addendum)
Patient ID: Kristy Mcdonald, female   DOB: 12-Aug-1932, 83 y.o.   MRN: [id number]  PROGRESS NOTE    Doll Frazee Baruch  HGD:[id number] DOB: 11-12-1932 DOA: 08/06/2016  PCP: Lucianne Lei, MD   Brief Narrative:  83 year old female with history of COPD, chronic systolic and diastolic CHF, CKD stage 4, hypothyroidism, atrial fibrillation on apixaban who presented to ED with worsening shortness of breath at rest and with exertion. Pt also had non productive cough. She was admitted to SDU for management of acute CHF exacerbation.    Assessment & Plan:   Principal Problem:   Acute on chronic respiratory failure with hypoxia (HCC) / Acute systolic and diastolic CHF - ECHO on this admission showed EF 60% and grade 1 DD  - Per cardio and renal recommendations, lasix stopped - Pt is 1.7 L + on admission but not significantly volume overloaded on exam - BNP 174 on admission - Continue bisoprolol and Cardizem - Continue daily weight and strict intake and output  Active Problems:   Atrial fibrillation with RVR (HCC) - CHADS vasc score at least 5 - On AC with apixaban  - Currently on Cardizem PO 60 mg Q 6 hours - May need antiarrhythmic therapy - No role for DCCV at this time    Left lower lobe pneumonia - Possible consolidation in left lower lung lobe - Blood cx negative - Continue azithromycin and rocephin    Depression - Stable - Continue elavil    Diabetes mellitus with diabetic nephropathy - Continue SSI - Per DM coordinator, added Lantus 10 units daily    Acute renal failure superimposed on CKD stage 4 - Cr. 2.16 in 04/2016 - No wiwth oliguria and Cr up to 2.99 - Possible ATN from hypotension with a fib and RVR - Lasix on hold and aldactone on hold    Hypothyroidism - Continue synthroid     COPD - Continue BD's    DVT prophylaxis: on apixaban  Code Status: full code  Family Communication: no family at the bedside  Disposition Plan: home versus SNF once cleared by  cardio, ren   Consultants:   Cardio   Procedures:  None  Antimicrobials:   Azithro 7/22 -->  Rocephin 7/22 -->   Subjective: No overnight events.    Objective: Vitals:   08/27/16 0725 08/27/16 0733 08/27/16 0800 08/27/16 1200  BP:  130/73 119/69 118/62  Pulse:  95 (!) 119 (!) 117  Resp:  20 (!) 22 (!) 22  Temp:  (!) 97.4 F (36.3 C)  97.8 F (36.6 C)  TempSrc:  Oral  Oral  SpO2: (!) 88%  92% 96%  Weight:      Height:        Intake/Output Summary (Last 24 hours) at 08/27/16 1509 Last data filed at 08/27/16 1010  Gross per 24 hour  Intake           474.92 ml  Output              225 ml  Net           249.92 ml   Filed Weights   09/02/2016 1838 08/26/16 0500 08/27/16 0405  Weight: 98.8 kg (217 lb 13 oz) 94.3 kg (207 lb 14.3 oz) 93.1 kg (205 lb 4 oz)    Physical Exam  Constitutional: Appears well-developed and well-nourished. No distress.  CVS: RRR, S1/S2 + Pulmonary: diminished breath sounds, no wheezing Abdominal: Soft. BS +,  no distension, tenderness, rebound or guarding.  Musculoskeletal: Normal range of motion. No edema and no tenderness.  Lymphadenopathy: No lymphadenopathy noted, cervical, inguinal. Neuro: Oriented to name but not to place or time Skin: Skin is warm and dry. Chronic venous skin changes in b/l LE  Psychiatric: Not agitated or restless     Data Reviewed: I have personally reviewed following labs and imaging studies  CBC:  Recent Labs Lab 08/16/2016 1435 08/26/16 0211 08/27/16 0231  WBC 15.9* 12.6* 12.9*  HGB 11.6* 11.4* 11.0*  HCT 37.0 36.9 35.0*  MCV 88.5 88.9 89.1  PLT 402* 340 829   Basic Metabolic Panel:  Recent Labs Lab 08/27/2016 1435 08/26/16 0211 08/27/16 0231  NA 130* 128* 128*  K 4.8 5.0 5.5*  CL 91* 94* 92*  CO2 24 23 23   GLUCOSE 270* 230* 209*  BUN 47* 50* 59*  CREATININE 2.52* 2.55* 2.99*  CALCIUM 10.0 9.0 9.1   GFR: Estimated Creatinine Clearance: 16.6 mL/min (A) (by C-G formula based on SCr of  2.99 mg/dL (H)). Liver Function Tests: No results for input(s): AST, ALT, ALKPHOS, BILITOT, PROT, ALBUMIN in the last 168 hours. No results for input(s): LIPASE, AMYLASE in the last 168 hours. No results for input(s): AMMONIA in the last 168 hours. Coagulation Profile: No results for input(s): INR, PROTIME in the last 168 hours. Cardiac Enzymes: No results for input(s): CKTOTAL, CKMB, CKMBINDEX, TROPONINI in the last 168 hours. BNP (last 3 results)  Recent Labs  06/04/16 1014  PROBNP 400   HbA1C: No results for input(s): HGBA1C in the last 72 hours. CBG:  Recent Labs Lab 08/26/16 1153 08/26/16 1612 08/26/16 2120 08/27/16 0732 08/27/16 1159  GLUCAP 202* 163* 186* 218* 246*   Lipid Profile: No results for input(s): CHOL, HDL, LDLCALC, TRIG, CHOLHDL, LDLDIRECT in the last 72 hours. Thyroid Function Tests: No results for input(s): TSH, T4TOTAL, FREET4, T3FREE, THYROIDAB in the last 72 hours. Anemia Panel: No results for input(s): VITAMINB12, FOLATE, FERRITIN, TIBC, IRON, RETICCTPCT in the last 72 hours. Urine analysis:    Component Value Date/Time   COLORURINE YELLOW 08/06/2016 2012   APPEARANCEUR CLEAR 08/19/2016 2012   LABSPEC 1.014 08/16/2016 2012   PHURINE 5.0 08/12/2016 2012   GLUCOSEU NEGATIVE 08/05/2016 2012   HGBUR NEGATIVE 08/18/2016 2012   [place] NEGATIVE 08/24/2016 2012   [place] NEGATIVE 09/01/2016 2012   PROTEINUR 30 (A) 08/23/2016 2012   NITRITE NEGATIVE 09/02/2016 2012   LEUKOCYTESUR NEGATIVE 08/23/2016 2012   Sepsis Labs: @LABRCNTIP (procalcitonin:4,lacticidven:4)   ) Recent Results (from the past 240 hour(s))  MRSA PCR Screening     Status: None   Collection Time: 08/22/2016  6:48 PM  Result Value Ref Range Status   MRSA by PCR NEGATIVE NEGATIVE Final      Radiology Studies: Dg Chest Portable 1 View Result Date: 08/24/2016 Enlargement of cardiac silhouette. Atelectasis versus consolidation LEFT lower lobe. Aortic Atherosclerosis  (ICD10-I70.0).     Scheduled Meds: . amitriptyline  10 mg Oral QHS  . apixaban  2.5 mg Oral BID  . bisoprolol  5 mg Oral Daily  . diazepam  2.5 mg Oral QHS  . diltiazem  60 mg Oral Q6H  . insulin aspart  0-15 Units Subcutaneous TID WC  . insulin aspart  0-5 Units Subcutaneous QHS  . insulin glargine  10 Units Subcutaneous Daily  . levothyroxine  50 mcg Oral QAC breakfast  . mouth rinse  15 mL Mouth Rinse BID  . methylPREDNISolone (SOLU-MEDROL) injection  125 mg Intravenous Once   Followed by  .  methylPREDNISolone (SOLU-MEDROL) injection  60 mg Intravenous Q12H  . senna  1 tablet Oral Daily  . sodium chloride flush  3 mL Intravenous Q12H  . sodium polystyrene  30 g Oral Once  . tiotropium  18 mcg Inhalation Daily   Continuous Infusions: . sodium chloride    . azithromycin Stopped (08/26/16 1719)  . cefTRIAXone (ROCEPHIN)  IV 1 g (08/27/16 1505)     LOS: 2 days    Time spent: 25 minutes Greater than 50% of the time spent on counseling and coordinating the care.   Leisa Lenz, MD [facility] Pager [phone]  If 7PM-7AM, please contact night-coverage [email or web address] Password [facility] 08/27/2016, 3:09 PM

## 2016-08-28 ENCOUNTER — Inpatient Hospital Stay (HOSPITAL_COMMUNITY): Payer: Medicare Other

## 2016-08-28 DIAGNOSIS — E039 Hypothyroidism, unspecified: Secondary | ICD-10-CM

## 2016-08-28 DIAGNOSIS — E11319 Type 2 diabetes mellitus with unspecified diabetic retinopathy without macular edema: Secondary | ICD-10-CM

## 2016-08-28 DIAGNOSIS — R0602 Shortness of breath: Secondary | ICD-10-CM

## 2016-08-28 DIAGNOSIS — N179 Acute kidney failure, unspecified: Secondary | ICD-10-CM

## 2016-08-28 DIAGNOSIS — J9620 Acute and chronic respiratory failure, unspecified whether with hypoxia or hypercapnia: Secondary | ICD-10-CM

## 2016-08-28 DIAGNOSIS — I483 Typical atrial flutter: Secondary | ICD-10-CM

## 2016-08-28 DIAGNOSIS — R0902 Hypoxemia: Secondary | ICD-10-CM

## 2016-08-28 LAB — CBC
HCT: 34.4 % — ABNORMAL LOW (ref 36.0–46.0)
Hemoglobin: 10.5 g/dL — ABNORMAL LOW (ref 12.0–15.0)
MCH: 27.1 pg (ref 26.0–34.0)
MCHC: 30.5 g/dL (ref 30.0–36.0)
MCV: 88.7 fL (ref 78.0–100.0)
Platelets: 383 10*3/uL (ref 150–400)
RBC: 3.88 MIL/uL (ref 3.87–5.11)
RDW: 13.5 % (ref 11.5–15.5)
WBC: 12.8 10*3/uL — ABNORMAL HIGH (ref 4.0–10.5)

## 2016-08-28 LAB — GLUCOSE, CAPILLARY
GLUCOSE-CAPILLARY: 218 mg/dL — AB (ref 65–99)
Glucose-Capillary: 144 mg/dL — ABNORMAL HIGH (ref 65–99)
Glucose-Capillary: 196 mg/dL — ABNORMAL HIGH (ref 65–99)
Glucose-Capillary: 222 mg/dL — ABNORMAL HIGH (ref 65–99)

## 2016-08-28 LAB — BASIC METABOLIC PANEL
Anion gap: 12 (ref 5–15)
BUN: 73 mg/dL — ABNORMAL HIGH (ref 6–20)
CALCIUM: 8.9 mg/dL (ref 8.9–10.3)
CHLORIDE: 92 mmol/L — AB (ref 101–111)
CO2: 26 mmol/L (ref 22–32)
CREATININE: 3.34 mg/dL — AB (ref 0.44–1.00)
GFR calc non Af Amer: 12 mL/min — ABNORMAL LOW (ref >60)
GFR, EST AFRICAN AMERICAN: 14 mL/min — AB (ref >60)
GLUCOSE: 119 mg/dL — AB (ref 65–99)
Potassium: 4.8 mmol/L (ref 3.5–5.1)
Sodium: 130 mmol/L — ABNORMAL LOW (ref 135–145)

## 2016-08-28 LAB — HIV ANTIBODY (ROUTINE TESTING W REFLEX): HIV SCREEN 4TH GENERATION: NONREACTIVE

## 2016-08-28 NOTE — Plan of Care (Signed)
Problem: Physical Regulation: Goal: Ability to maintain clinical measurements within normal limits will improve Outcome: Progressing Patient remains in A flutter overnight with rate 80-120. With movement/ agitation patient's HR increases. Noticing that after giving patient valium at night she becomes more confused and is unable to sleep.   Problem: Skin Integrity: Goal: Risk for impaired skin integrity will decrease Outcome: Progressing Patient makes big turns in bed with assistance. Continue to encourage turns and monitor skin.   Problem: Tissue Perfusion: Goal: Risk factors for ineffective tissue perfusion will decrease Outcome: Completed/Met Date Met: 08/28/16 Patient on home eliquis.   Problem: Activity: Goal: Risk for activity intolerance will decrease Outcome: Progressing Patient would benefit from PT/ OT evaluation. She seems to be getting more weak during hospitalization. Was able to get up with standby assist to commode 3 days ago and is now having a difficult time doing this with assistance.   Problem: Fluid Volume: Goal: Ability to maintain a balanced intake and output will improve Outcome: Progressing Patient with poor Urinary Output. Also poor PO intake.   Problem: Nutrition: Goal: Adequate nutrition will be maintained Outcome: Progressing Encourage patient to eat/ drink. She has a poor appetite and is not drinking much fluids.   Problem: Bowel/Gastric: Goal: Will not experience complications related to bowel motility Outcome: Not Progressing Patient receiving medication daily to help with bowel movements, but has still not had one since 7/18.

## 2016-08-28 NOTE — Evaluation (Signed)
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 Admitted with concern for LLL PNA, acute systolic CHF, hypoxic respiratory failure, atrial fibrillation with RVR, and AKI.   Clinical Impression   Pt admitted with above. She demonstrates the below listed deficits and will benefit from continued OT to maximize safety and independence with BADLs.  Pt presents to OT with decreased activity tolerance, impaired balance, generalized weakness, and impaired cognition.  She currently requires mod A +2 for functional mobility and max A, overall for ADLs.  She lives with spouse at home, and has had a progressive decline in function.  Recommend SNF level rehab at discharge.       Follow Up Recommendations  SNF    Equipment Recommendations  None recommended by OT    Recommendations for Other Services       Precautions / Restrictions Precautions Precautions: Fall Restrictions Weight Bearing Restrictions: No      Mobility Bed Mobility Overal bed mobility: Needs Assistance Bed Mobility: Supine to Sit     Supine to sit: Mod assist     General bed mobility comments: Assist to raise trunk to upright sitting position.  Transfers Overall transfer level: Needs assistance Equipment used: Rolling walker (2 wheeled) Transfers: Sit to/from [facility] Sit to Stand: Mod assist;+2 physical assistance Stand pivot transfers: Mod assist;+2 physical assistance       General transfer comment: Verbal cues for hand placement - patient initially trying to pull up holding therapist hand.  Required +2 mod assist to rise to stance.  Patient able to take several shuffle steps to pivot to chair, but sat abruptly before getting into position.      Balance Overall balance assessment: Needs assistance;History of  Falls Sitting-balance support: No upper extremity supported;Feet supported Sitting balance-Leahy Scale: Fair     Standing balance support: Bilateral upper extremity supported Standing balance-Leahy Scale: Poor                             ADL either performed or assessed with clinical judgement   ADL Overall ADL's : Needs assistance/impaired Eating/Feeding: Set up;Sitting   Grooming: Wash/dry hands;Wash/dry face;Oral care;Brushing hair;Minimal assistance;Sitting   Upper Body Bathing: Moderate assistance;Sitting   Lower Body Bathing: Maximal assistance;Sit to/from stand   Upper Body Dressing : Moderate assistance;Sitting   Lower Body Dressing: Maximal assistance;Sit to/from stand   Toilet Transfer: Moderate assistance;+2 for physical assistance;Stand-pivot;BSC;RW   Toileting- Clothing Manipulation and Hygiene: Total assistance;Sit to/from stand       Functional mobility during ADLs: Moderate assistance;+2 for physical assistance;Rolling walker General ADL Comments: Pt fatigues quickly with activity      Vision         Perception     Praxis      Pertinent Vitals/Pain Pain Assessment: No/denies pain     Hand Dominance     Extremity/Trunk Assessment Upper Extremity Assessment Upper Extremity Assessment: Generalized weakness   Lower Extremity Assessment Lower Extremity Assessment: Defer to PT evaluation   Cervical / Trunk Assessment Cervical / Trunk Assessment: Normal   Communication Communication Communication: No difficulties   Cognition Arousal/Alertness: Awake/alert Behavior During Therapy: Impulsive (Irritated) Overall Cognitive Status: Impaired/Different from baseline Area of Impairment: Orientation;Attention;Memory;Following commands;Safety/judgement;Awareness;Problem solving  Orientation Level: Disoriented to;Place;Time;Situation Current Attention Level: Sustained Memory: Decreased short-term memory Following  Commands: Follows one step commands inconsistently;Follows one step commands with increased time Safety/Judgement: Decreased awareness of safety   Problem Solving: Slow processing;Difficulty sequencing;Requires verbal cues     General Comments  BP 98/76; HR 124; 02 sats 88%    Exercises     Shoulder Instructions      Home Living Family/patient expects to be discharged to:: Skilled nursing facility Living Arrangements: Spouse/significant other;Children Available Help at Discharge: Family;Available 24 hours/day Type of Home: House Home Access: Stairs to enter [employer] of Steps: 4-5 Entrance Stairs-Rails: Right Home Layout: One level     Bathroom Shower/Tub:  (sponge bathes)         Home Equipment: Toilet riser;Walker - 4 wheels;Shower seat;Wheelchair - manual          Prior Functioning/Environment Level of Independence: Independent with assistive device(s);Needs assistance  Gait / Transfers Assistance Needed: Pt ambulated with RW, but h/o recent falls ADL's / Homemaking Assistance Needed: pt required assist with ADLs - did not elaborate on amount.  She sponge bathes             OT Problem List: Decreased strength;Decreased activity tolerance;Impaired balance (sitting and/or standing);Decreased cognition;Decreased safety awareness;Decreased knowledge of use of DME or AE;Cardiopulmonary status limiting activity      OT Treatment/Interventions: Self-care/ADL training;Therapeutic exercise;Energy conservation;DME and/or AE instruction;Therapeutic activities;Cognitive remediation/compensation;Patient/family education;Balance training    OT Goals(Current goals can be found in the care plan section) Acute Rehab OT Goals Patient Stated Goal: To go home OT Goal Formulation: With patient/family Time For Goal Achievement: 09/11/16 Potential to Achieve Goals: Good ADL Goals Pt Will Perform Grooming: with min assist;standing Pt Will Perform Upper Body Bathing:  with supervision;with set-up;sitting Pt Will Perform Lower Body Bathing: with min assist;sit to/from stand Pt Will Perform Upper Body Dressing: with supervision;with set-up;sitting Pt Will Perform Lower Body Dressing: with min assist;sit to/from stand Pt Will Transfer to Toilet: ambulating;with min assist;bedside commode;grab bars;regular height toilet Pt Will Perform Toileting - Clothing Manipulation and hygiene: with min assist;sit to/from stand  OT Frequency: Min 2X/week   Barriers to D/C:            Co-evaluation PT/OT/SLP Co-Evaluation/Treatment: Yes Reason for Co-Treatment: Complexity of the patient's impairments (multi-system involvement);For patient/therapist safety PT goals addressed during session: Mobility/safety with mobility OT goals addressed during session: ADL's and self-care      AM-PAC PT "6 Clicks" Daily Activity     Outcome Measure Help from another person eating meals?: A Little Help from another person taking care of personal grooming?: A Little Help from another person toileting, which includes using toliet, bedpan, or urinal?: A Lot Help from another person bathing (including washing, rinsing, drying)?: A Lot Help from another person to put on and taking off regular upper body clothing?: A Lot Help from another person to put on and taking off regular lower body clothing?: A Lot 6 Click Score: 14   End of Session Equipment Utilized During Treatment: Rolling walker;Oxygen Nurse Communication: Mobility status  Activity Tolerance: Patient limited by fatigue Patient left: in chair;with call bell/phone within reach;with chair alarm set;with family/visitor present  OT Visit Diagnosis: Unsteadiness on feet (R26.81)                Time: 8250-5397 OT Time Calculation (min): 22 min Charges:  OT General Charges $OT Visit: 1 Procedure OT Evaluation $OT Eval Moderate Complexity: 1 Procedure G-Codes:     Ellard Artis  [place], OTR/L [phone]   Lucille Passy  M 08/28/2016, 1:54 PM

## 2016-08-28 NOTE — Evaluation (Signed)
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 Admitted with concern for LLL PNA, acute systolic CHF, hypoxic respiratory failure, atrial fibrillation with RVR, and AKI.  Clinical Impression  Patient presents with problems listed below.  Will benefit from acute PT to maximize functional mobility prior to discharge.  Patient was independent with assistive device pta.  Today required +2 mod assist for mobility/transfers and unable to ambulate.  Recommend ST-SNF at d/c for continued therapy with goal to return home safely.    Follow Up Recommendations SNF;Supervision/Assistance - 24 hour    Equipment Recommendations  None recommended by PT    Recommendations for Other Services       Precautions / Restrictions Precautions Precautions: Fall Restrictions Weight Bearing Restrictions: No      Mobility  Bed Mobility Overal bed mobility: Needs Assistance Bed Mobility: Supine to Sit     Supine to sit: Mod assist     General bed mobility comments: Assist to raise trunk to upright sitting position.  Transfers Overall transfer level: Needs assistance Equipment used: Rolling walker (2 wheeled) Transfers: Sit to/from [facility] Sit to Stand: Mod assist;+2 physical assistance Stand pivot transfers: Mod assist;+2 physical assistance       General transfer comment: Verbal cues for hand placement - patient initially trying to pull up holding therapist hand.  Required +2 mod assist to rise to stance.  Patient able to take several shuffle steps to pivot to chair, but sat abruptly before getting into position.    Ambulation/Gait             General Gait Details: Unable  Stairs            Wheelchair Mobility    Modified Rankin (Stroke Patients Only)       Balance Overall  balance assessment: Needs assistance;History of Falls Sitting-balance support: No upper extremity supported;Feet supported Sitting balance-Leahy Scale: Fair     Standing balance support: Bilateral upper extremity supported Standing balance-Leahy Scale: Poor                               Pertinent Vitals/Pain Pain Assessment: No/denies pain    Home Living Family/patient expects to be discharged to:: Skilled nursing facility Living Arrangements: Spouse/significant other;Children Available Help at Discharge: Family;Available 24 hours/day Type of Home: House Home Access: Stairs to enter Entrance Stairs-Rails: Right Entrance Stairs-Number of Steps: 4-5 Home Layout: One level Home Equipment: Toilet riser;Walker - 4 wheels;Shower seat;Wheelchair - manual      Prior Function Level of Independence: Independent with assistive device(s)               Hand Dominance        Extremity/Trunk Assessment   Upper Extremity Assessment Upper Extremity Assessment: Defer to OT evaluation    Lower Extremity Assessment Lower Extremity Assessment: Generalized weakness (Bil LE neuropathy with "numbness" and sensitivity)       Communication   Communication: No difficulties  Cognition Arousal/Alertness: Awake/alert Behavior During Therapy: Impulsive (Irritated) Overall Cognitive Status: Impaired/Different from baseline Area of Impairment: Orientation;Attention;Memory;Following commands;Safety/judgement;Awareness;Problem solving                 Orientation Level: Disoriented to;Place;Time;Situation Current Attention Level: Sustained Memory: Decreased short-term memory Following Commands: Follows one step commands inconsistently;Follows one step commands  with increased time Safety/Judgement: Decreased awareness of safety   Problem Solving: Slow processing;Difficulty sequencing;Requires verbal cues        General Comments      Exercises     Assessment/Plan     PT Assessment Patient needs continued PT services  PT Problem List Decreased strength;Decreased activity tolerance;Decreased balance;Decreased mobility;Decreased cognition;Decreased knowledge of use of DME;Decreased safety awareness;Cardiopulmonary status limiting activity;Impaired sensation;Obesity       PT Treatment Interventions DME instruction;Gait training;Functional mobility training;Therapeutic activities;Therapeutic exercise;Balance training;Cognitive remediation;Patient/family education    PT Goals (Current goals can be found in the Care Plan section)  Acute Rehab PT Goals Patient Stated Goal: To go home PT Goal Formulation: With patient/family Time For Goal Achievement: 09/11/16 Potential to Achieve Goals: Good    Frequency Min 3X/week   Barriers to discharge        Co-evaluation PT/OT/SLP Co-Evaluation/Treatment: Yes Reason for Co-Treatment: For patient/therapist safety PT goals addressed during session: Mobility/safety with mobility         AM-PAC PT "6 Clicks" Daily Activity  Outcome Measure Difficulty turning over in bed (including adjusting bedclothes, sheets and blankets)?: A Little Difficulty moving from lying on back to sitting on the side of the bed? : Total Difficulty sitting down on and standing up from a chair with arms (e.g., wheelchair, bedside commode, etc,.)?: Total Help needed moving to and from a bed to chair (including a wheelchair)?: A Lot Help needed walking in hospital room?: A Lot Help needed climbing 3-5 steps with a railing? : Total 6 Click Score: 10    End of Session Equipment Utilized During Treatment: Oxygen Activity Tolerance: Patient limited by fatigue Patient left: in chair;with call bell/phone within reach;with chair alarm set;with family/visitor present Nurse Communication: Mobility status (In chair) PT Visit Diagnosis: Unsteadiness on feet (R26.81);Difficulty in walking, not elsewhere classified (R26.2);Muscle weakness  (generalized) (M62.81)    Time: 1610-9604 PT Time Calculation (min) (ACUTE ONLY): 22 min   Charges:   PT Evaluation $PT Eval High Complexity: 1 Procedure     PT G Codes:        Carita Pian. Sanjuana Kava, [facility] Acute Rehab Services Pager [phone]   Despina Pole 08/28/2016, 1:03 PM

## 2016-08-28 NOTE — Progress Notes (Signed)
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 Admitted with concern for LLL PNA, acute systolic CHF, hypoxic respiratory failure, atrial fibrillation with RVR, and AKI. She was started antibiotics, IV lasix, and spironolactone. Cardiology was consulted for rapid atrial flutter and cardizem was initiated. Unfortunately UOP declined and renal function has failed to improve. Nephrology consulted for further management.   Subjective:  Hypotension this AM, SOB on high flow nasal cannula.   07/24 0701 - 07/25 0700 In: 206 [P.O.:200; I.V.:6] Out: 225 [Urine:225]  Filed Weights   08/26/16 0500 08/27/16 0405 08/28/16 0500  Weight: 207 lb 14.3 oz (94.3 kg) 205 lb 4 oz (93.1 kg) 218 lb 7.6 oz (99.1 kg)    Scheduled Meds: . amitriptyline  10 mg Oral QHS  . apixaban  2.5 mg Oral BID  . bisoprolol  5 mg Oral Daily  . diazepam  2.5 mg Oral QHS  . diltiazem  60 mg Oral Q6H  . insulin aspart  0-15 Units Subcutaneous TID WC  . insulin aspart  0-5 Units Subcutaneous QHS  . insulin glargine  10 Units Subcutaneous Daily  . levothyroxine  50 mcg Oral QAC breakfast  . mouth rinse  15 mL Mouth Rinse BID  . methylPREDNISolone (SOLU-MEDROL) injection  125 mg Intravenous Once   Followed by  . methylPREDNISolone (SOLU-MEDROL) injection  60 mg Intravenous Daily  . senna  1 tablet Oral Daily  . sodium chloride flush  3 mL Intravenous Q12H  . tiotropium  18 mcg Inhalation Daily   Continuous Infusions: . sodium chloride    . azithromycin Stopped (08/27/16 1754)  . cefTRIAXone (ROCEPHIN)  IV Stopped (08/27/16 1535)   PRN Meds:.sodium chloride, acetaminophen, HYDROcodone-acetaminophen, magic mouthwash w/lidocaine, ondansetron (ZOFRAN) IV, sodium chloride flush  Current Labs: reviewed    Physical Exam:  Blood pressure 127/82, pulse 74, temperature (!) 97.5 F (36.4 C), temperature source Oral, resp. rate 18, height 5\' 5"  (1.651 m), weight 218 lb 7.6 oz  (99.1 kg), SpO2 95 %. Gen: Sleeping comfortably, NAD Heart: RRR Lungs: Bibasilar crackles, L>R Ext: Bilateral lower extremity edema  Skin: no rash   A 1. Acute on Chronic Renal Dysfunction: Baseline CKD III - IV (Scr ~2.0) now with oliguria. Scr up to 3.3 today. Likely ATN in the setting of ongoing hypotension and afib w/ RVR on admission (now back in sinus). UA is unremarkable.  2. Hyperkalemia: Resolved with kayexalate yesterday.  3. Hyponatremia: In the setting of acute renal failure/reduced GFR and acute dCHF. Likely hypotonic, hypervolemic. Fluid restriction added yesterday, sodium somewhat improved 128 -> 130.  4. Acute on Chronic Respiratory Failure: Multifactorial due to CAP, acute dCHF exacerbation, and underlying COPD. 5. Paroxsymal a-fib/a-flutter: Card followings, on PO cardizem. Spontaneously converted to sinus rhythm - no role for DCCV at this time. On reduced dose Eliquis, however if renal function continues to decline, will require transition to warfarin.   P 1. Continue to monitor renal function and volume status closely. No urgent need for CRRT at this time. Overall clinic status appears tenuous. Would likely not tolerate diuresis with hypotension. Will follow up AM labs. Strict I/Os, daily weights, avoid nephrotoxic agents.  2. Holding lasix and spironolactone  3. Fluid restrict, 1.5L  4. Other issues per primary and cards.    Velna Ochs, M.D. - PGY2 Pager: [phone] 08/28/2016, 11:33 AM    Recent Labs Lab 08/26/16 0211 08/27/16 0231 08/28/16 0513  NA 128* 128* 130*  K  5.0 5.5* 4.8  CL 94* 92* 92*  CO2 23 23 26   GLUCOSE 230* 209* 119*  BUN 50* 59* 73*  CREATININE 2.55* 2.99* 3.34*  CALCIUM 9.0 9.1 8.9    Recent Labs Lab 08/26/16 0211 08/27/16 0231 08/28/16 0513  WBC 12.6* 12.9* 12.8*  HGB 11.4* 11.0* 10.5*  HCT 36.9 35.0* 34.4*  MCV 88.9 89.1 88.7  PLT 340 342 383

## 2016-08-28 NOTE — Progress Notes (Signed)
Progress Note  Patient Name: Kristy Mcdonald Date of Encounter: 08/28/2016  Primary Cardiologist: Dr Meda Coffee  Subjective   SOB on O2- no real improvement from admission according to the pt  Inpatient Medications    Scheduled Meds: . amitriptyline  10 mg Oral QHS  . apixaban  2.5 mg Oral BID  . bisoprolol  5 mg Oral Daily  . diazepam  2.5 mg Oral QHS  . diltiazem  60 mg Oral Q6H  . insulin aspart  0-15 Units Subcutaneous TID WC  . insulin aspart  0-5 Units Subcutaneous QHS  . insulin glargine  10 Units Subcutaneous Daily  . levothyroxine  50 mcg Oral QAC breakfast  . mouth rinse  15 mL Mouth Rinse BID  . methylPREDNISolone (SOLU-MEDROL) injection  125 mg Intravenous Once   Followed by  . methylPREDNISolone (SOLU-MEDROL) injection  60 mg Intravenous Daily  . senna  1 tablet Oral Daily  . sodium chloride flush  3 mL Intravenous Q12H  . tiotropium  18 mcg Inhalation Daily   Continuous Infusions: . sodium chloride    . azithromycin Stopped (08/27/16 1754)  . cefTRIAXone (ROCEPHIN)  IV Stopped (08/27/16 1535)   PRN Meds: sodium chloride, acetaminophen, HYDROcodone-acetaminophen, magic mouthwash w/lidocaine, ondansetron (ZOFRAN) IV, sodium chloride flush   Vital Signs    Vitals:   08/28/16 0500 08/28/16 0559 08/28/16 0713 08/28/16 0855  BP:  94/64 (!) 86/55 127/82  Pulse:   83 74  Resp:   (!) 23 18  Temp:   (!) 97.5 F (36.4 C)   TempSrc:   Oral   SpO2:   98% 95%  Weight: 218 lb 7.6 oz (99.1 kg)     Height:        Intake/Output Summary (Last 24 hours) at 08/28/16 1046 Last data filed at 08/28/16 0920  Gross per 24 hour  Intake              206 ml  Output              150 ml  Net               56 ml   Filed Weights   08/26/16 0500 08/27/16 0405 08/28/16 0500  Weight: 207 lb 14.3 oz (94.3 kg) 205 lb 4 oz (93.1 kg) 218 lb 7.6 oz (99.1 kg)    Telemetry    Atrial flutter with variable VR-rate 80-100 - Personally Reviewed  ECG    08/28/16- Atrial  flutter with variable VR, low voltage, poor anterior RW progression - Personally Reviewed  Physical Exam   GEN: Pale, obese, on O2, NAD Neck: No JVD Cardiac: irreg irreg, no murmurs, rubs, or gallops.  Respiratory: absent breath sounds 1/2 bilaterally  GI: Soft, nontender, non-distended  MS: 1+ LE edema; No deformity. Neuro:  Nonfocal  Psych: Normal affect   Labs    Chemistry Recent Labs Lab 08/26/16 0211 08/27/16 0231 08/28/16 0513  NA 128* 128* 130*  K 5.0 5.5* 4.8  CL 94* 92* 92*  CO2 23 23 26   GLUCOSE 230* 209* 119*  BUN 50* 59* 73*  CREATININE 2.55* 2.99* 3.34*  CALCIUM 9.0 9.1 8.9  GFRNONAA 17* 14* 12*  GFRAA 19* 16* 14*  ANIONGAP 11 13 12      Hematology Recent Labs Lab 08/26/16 0211 08/27/16 0231 08/28/16 0513  WBC 12.6* 12.9* 12.8*  RBC 4.15 3.93 3.88  HGB 11.4* 11.0* 10.5*  HCT 36.9 35.0* 34.4*  MCV 88.9 89.1 88.7  MCH 27.5  28.0 27.1  MCHC 30.9 31.4 30.5  RDW 13.3 13.3 13.5  PLT 340 342 383    Cardiac EnzymesNo results for input(s): TROPONINI in the last 168 hours.  Recent Labs Lab 08/16/2016 1448  TROPIPOC 0.04     BNP Recent Labs Lab 08/19/2016 1435 08/27/16 0231  BNP 389.5* 174.8*     DDimer No results for input(s): DDIMER in the last 168 hours.   Radiology    Dg Chest Port 1 View  Result Date: 08/28/2016 CLINICAL DATA:  Shortness of breath and cough today. EXAM: PORTABLE CHEST 1 VIEW COMPARISON:  08/26/2016 FINDINGS: Increased densities in the left upper chest and left suprahilar region. Increased densities at both lung bases are suggestive for consolidation and pleural fluid. Again noted is cardiomegaly. Atherosclerotic calcifications at the aortic arch. Negative for pneumothorax. Increased densities in the right apex region. IMPRESSION: Increased basilar chest densities and markedly increased densities in the left upper lung/left suprahilar region. The left upper chest disease may represent atelectasis but infection cannot be  excluded. Increased basilar chest densities are suggestive for pleural effusions with consolidation. Cardiomegaly. Electronically Signed   By: Markus Daft M.D.   On: 08/28/2016 09:20    Cardiac Studies   Echo 08/26/16- Study Conclusions  - Left ventricle: The cavity size was normal. Wall thickness was   increased in a pattern of mild LVH. Systolic function was normal.   The estimated ejection fraction was in the range of 60% to 65%.   Incoordinate septal motion. Doppler parameters are consistent   with abnormal left ventricular relaxation (grade 1 diastolic   dysfunction). The E/e&' ratio is between 8-15, suggesting   indeterminate LV filling pressure. - Mitral valve: Mildly thickened leaflets . There was trivial   regurgitation. - Left atrium: The atrium was normal in size. - Inferior vena cava: The vessel was dilated. The respirophasic   diameter changes were blunted (< 50%), consistent with elevated   central venous pressure.  Impressions:  - LVEF 60-65%, mild LVH, incoordinate septal motion, grade 1 DD,   indeterminate LV filling pressure, trivial MR, normal LA size,   dilated IVC. Compared to a prior echo in 05/2016, the LVEF has   improved.  Patient Profile     83 y.o. female admitted with acute on chronic respiratory failure 08/11/2016 and found to be in recurrent atrial flutter.   Assessment & Plan    1.  Acute on chronic resp failure/CAP/COPD: She says breathing somewhat better.  Diminished breath sounds, no wheezing.  Abx, steroids, inhalers per IM.  2.  PAF RVR: Pt presented with rapid aflutter in the setting of #1.  She initially converted but went back into Aflutter.  Rates currently 80-100.  She is asymptomatic. On increased PO dilt 60 q6h.  Suspect atrial arrhythmias will cont to be an issue in setting of acute resp illness.  May need to consider antiarrhythmic therapy.  No role for DCCV @ this ponit.  Cont eliquis @ reduced dose for now.  If renal fxn worsens, with  drop of CrCl <15, would need to transition to warfarin.  3.  Acute on chronic diastolic CHF:  EF 86-57% 8/46/9629. +1.7L since admission.  Not sure Wt is reliable- 200 to 218 lbs.  Not significantly volume overloaded on exam.  BNP only mildly elevated @ 174.8 in setting of renal failure.  Follow for now.  4.  AKI on CKD IV:  Diuretics on hold.  Creat sl worse this am.  Nephrology following. CR  clearance 21 today  5.  Tob Abuse:  Cessation advised.  6.  Hyperkalemia: mild - in setting of worsening renal fxn and Aldactone which has been stopped.   Plan: This appears to be primarily a pulmonary problem. MD to see.    Angelena Form, PA-C  08/28/2016, 10:46 AM     Patient seen and examined. Agree with assessment and plan. No chest pain. Atrial flutter in the 110 - 120 range; now on cardizem 60 q 6 and bisoprolol 5 mg started yesterday. Nowt yet steady state.  Cr worse with early Stage 5 kidney disease. Nephrology following. Hopefully will turn the corne and start to improve, otherwise may need to consider warfarin rather than eliquis.    Troy Sine, MD, [facility] 08/28/2016 12:55 PM

## 2016-08-28 NOTE — Progress Notes (Signed)
PROGRESS NOTE    Kristy Mcdonald  OZH:[id number] DOB: 04/14/1932 DOA: 08/15/2016 PCP: Lucianne Lei, MD   Brief Narrative: Kristy Mcdonald is a 83 y.o. chemotherapy she has COPD, chronic systolic and diastolic CHF, CKD stage IV, hypothyroidism, etc. fibrillation on expansion. He presented with concern for CHF exacerbation.   Assessment & Plan:   Principal Problem:   Acute on chronic respiratory failure with hypoxia (HCC) Active Problems:   Atrial fibrillation with RVR (HCC)   COPD (chronic obstructive pulmonary disease) (HCC)   Type 2 diabetes mellitus with retinopathy without macular edema, without long-term current use of insulin (HCC)   Hypothyroidism, acquired   Acute-on-chronic kidney injury ([place])   Acute diastolic CHF (congestive heart failure) ([place])   Community acquired pneumonia   Typical atrial flutter ([place])   AKI (acute kidney injury) ([place])   Acute on chronic respiratory failure with hypoxia Secondary to acute diastolic heart failure. Still requiring significant amounts of oxygen. She uses 2 L nasal cannula at home. -Management below -Wean oxygen to home k  Acute diastolic heart failure Echo from April significant for an EF of 50% which is improved to 60% on recent echo on July 23. Recent echo significant for grade 1 diastolic dysfunction. Does not appear to be contributing to respiratory status. Diuresis held in setting of hypertension and acute kidney injury. -Cardiology recommendations  Atrial fibrillation with RVR CHA2DS2-VASc Score is 5. Patient is currently on anticoagulation. In atrial flutter on personal review of telemetry. -Continue Eliquis -Continue Cardizem -Cardiology recommendations  Left lower lobe pneumonia Concern for possible consolidation in left lower lung. -Continue azithromycin and ceftriaxone  Depression Stable -Continue Elavil  Diabetes mellitus, type II, insulin-dependent -Continue [place] on insulin -Continue Lantus  Acute  kidney injury on CKD 4 Diabetic nephropathy Worsening creatinine with worsening oliguria. Possibly secondary to ATN from hypotension. -Nephrology recommendations -Lasix and Aldactone on hold secondary to hypotension  Hypothyroidism -Continue Synthroid  COPD Patient was treated initially with steroid burst. Does not appear to be in exacerbation. -continue bronchodilators   DVT prophylaxis: Eliquis Code Status: Full code Family Communication: None at bedside Disposition Plan: Discharge in several days   Consultants:   Cardiology  Nephrology  Procedures:   None  Antimicrobials:  Ceftriaxone (7/22>>  Azithromycin (7/22>>    Subjective: Patient reports no dyspnea or chest pain. She does report some palpitations.   Objective: Vitals:   08/28/16 0500 08/28/16 0559 08/28/16 0713 08/28/16 0855  BP:  94/64 (!) 86/55 127/82  Pulse:   83 74  Resp:   (!) 23 18  Temp:   (!) 97.5 F (36.4 C)   TempSrc:   Oral   SpO2:   98% 95%  Weight: 99.1 kg (218 lb 7.6 oz)     Height:        Intake/Output Summary (Last 24 hours) at 08/28/16 1127 Last data filed at 08/28/16 0920  Gross per 24 hour  Intake              206 ml  Output              150 ml  Net               56 ml   Filed Weights   08/26/16 0500 08/27/16 0405 08/28/16 0500  Weight: 94.3 kg (207 lb 14.3 oz) 93.1 kg (205 lb 4 oz) 99.1 kg (218 lb 7.6 oz)    Examination:  General exam: Appears calm and comfortable  Respiratory system: Clear  to auscultation. Respiratory effort normal. No wheezing.  Cardiovascular system: S1 & S2 heard, normal rate, regular rhythm. No murmurs. Gastrointestinal system: Abdomen is nondistended, soft and nontender. Normal bowel sounds heard. Central nervous system: Alert and oriented. No focal neurological deficits. Extremities: No edema. No calf tenderness Skin: No cyanosis. No rashes Psychiatry: Judgement and insight appear normal. Mood & affect appropriate.     Data Reviewed: I  have personally reviewed following labs and imaging studies  CBC:  Recent Labs Lab 08/29/2016 1435 08/26/16 0211 08/27/16 0231 08/28/16 0513  WBC 15.9* 12.6* 12.9* 12.8*  HGB 11.6* 11.4* 11.0* 10.5*  HCT 37.0 36.9 35.0* 34.4*  MCV 88.5 88.9 89.1 88.7  PLT 402* 340 342 109   Basic Metabolic Panel:  Recent Labs Lab 08/26/2016 1435 08/26/16 0211 08/27/16 0231 08/28/16 0513  NA 130* 128* 128* 130*  K 4.8 5.0 5.5* 4.8  CL 91* 94* 92* 92*  CO2 24 23 23 26   GLUCOSE 270* 230* 209* 119*  BUN 47* 50* 59* 73*  CREATININE 2.52* 2.55* 2.99* 3.34*  CALCIUM 10.0 9.0 9.1 8.9   GFR: Estimated Creatinine Clearance: 15.4 mL/min (A) (by C-G formula based on SCr of 3.34 mg/dL (H)). Liver Function Tests: No results for input(s): AST, ALT, ALKPHOS, BILITOT, PROT, ALBUMIN in the last 168 hours. No results for input(s): LIPASE, AMYLASE in the last 168 hours. No results for input(s): AMMONIA in the last 168 hours. Coagulation Profile: No results for input(s): INR, PROTIME in the last 168 hours. Cardiac Enzymes: No results for input(s): CKTOTAL, CKMB, CKMBINDEX, TROPONINI in the last 168 hours. BNP (last 3 results)  Recent Labs  06/04/16 1014  PROBNP 400   HbA1C: No results for input(s): HGBA1C in the last 72 hours. CBG:  Recent Labs Lab 08/27/16 0732 08/27/16 1159 08/27/16 1616 08/27/16 2132 08/28/16 0719  GLUCAP 218* 246* 315* 235* 144*   Lipid Profile: No results for input(s): CHOL, HDL, LDLCALC, TRIG, CHOLHDL, LDLDIRECT in the last 72 hours. Thyroid Function Tests: No results for input(s): TSH, T4TOTAL, FREET4, T3FREE, THYROIDAB in the last 72 hours. Anemia Panel: No results for input(s): VITAMINB12, FOLATE, FERRITIN, TIBC, IRON, RETICCTPCT in the last 72 hours. Sepsis Labs:  Recent Labs Lab 08/11/2016 1626  LATICACIDVEN 1.84    Recent Results (from the past 240 hour(s))  Blood Culture (routine x 2)     Status: None (Preliminary result)   Collection Time: 08/31/2016   4:08 PM  Result Value Ref Range Status   Specimen Description BLOOD LEFT ANTECUBITAL  Final   Special Requests   Final    BOTTLES DRAWN AEROBIC AND ANAEROBIC Blood Culture adequate volume   Culture NO GROWTH 3 DAYS  Final   Report Status PENDING  Incomplete  Blood Culture (routine x 2)     Status: None (Preliminary result)   Collection Time: 08/07/2016  4:12 PM  Result Value Ref Range Status   Specimen Description BLOOD LEFT FOREARM  Final   Special Requests   Final    BOTTLES DRAWN AEROBIC ONLY Blood Culture adequate volume   Culture NO GROWTH 3 DAYS  Final   Report Status PENDING  Incomplete  MRSA PCR Screening     Status: None   Collection Time: 08/21/2016  6:48 PM  Result Value Ref Range Status   MRSA by PCR NEGATIVE NEGATIVE Final    Comment:        The GeneXpert MRSA Assay (FDA approved for NASAL specimens only), is one component of a comprehensive MRSA  colonization surveillance program. It is not intended to diagnose MRSA infection nor to guide or monitor treatment for MRSA infections.          Radiology Studies: Dg Chest Port 1 View  Result Date: 08/28/2016 CLINICAL DATA:  Shortness of breath and cough today. EXAM: PORTABLE CHEST 1 VIEW COMPARISON:  08/24/2016 FINDINGS: Increased densities in the left upper chest and left suprahilar region. Increased densities at both lung bases are suggestive for consolidation and pleural fluid. Again noted is cardiomegaly. Atherosclerotic calcifications at the aortic arch. Negative for pneumothorax. Increased densities in the right apex region. IMPRESSION: Increased basilar chest densities and markedly increased densities in the left upper lung/left suprahilar region. The left upper chest disease may represent atelectasis but infection cannot be excluded. Increased basilar chest densities are suggestive for pleural effusions with consolidation. Cardiomegaly. Electronically Signed   By: Markus Daft M.D.   On: 08/28/2016 09:20         Scheduled Meds: . amitriptyline  10 mg Oral QHS  . apixaban  2.5 mg Oral BID  . bisoprolol  5 mg Oral Daily  . diazepam  2.5 mg Oral QHS  . diltiazem  60 mg Oral Q6H  . insulin aspart  0-15 Units Subcutaneous TID WC  . insulin aspart  0-5 Units Subcutaneous QHS  . insulin glargine  10 Units Subcutaneous Daily  . levothyroxine  50 mcg Oral QAC breakfast  . mouth rinse  15 mL Mouth Rinse BID  . methylPREDNISolone (SOLU-MEDROL) injection  125 mg Intravenous Once   Followed by  . methylPREDNISolone (SOLU-MEDROL) injection  60 mg Intravenous Daily  . senna  1 tablet Oral Daily  . sodium chloride flush  3 mL Intravenous Q12H  . tiotropium  18 mcg Inhalation Daily   Continuous Infusions: . sodium chloride    . azithromycin Stopped (08/27/16 1754)  . cefTRIAXone (ROCEPHIN)  IV Stopped (08/27/16 1535)     LOS: 3 days     Cordelia Poche, MD [facility] 08/28/2016, 11:27 AM Pager: [phone]  If 7PM-7AM, please contact night-coverage [email or web address] Password [facility] 08/28/2016, 11:27 AM

## 2016-08-29 ENCOUNTER — Inpatient Hospital Stay (HOSPITAL_COMMUNITY): Payer: Medicare Other

## 2016-08-29 DIAGNOSIS — E871 Hypo-osmolality and hyponatremia: Secondary | ICD-10-CM

## 2016-08-29 DIAGNOSIS — E872 Acidosis: Secondary | ICD-10-CM

## 2016-08-29 DIAGNOSIS — J9601 Acute respiratory failure with hypoxia: Secondary | ICD-10-CM

## 2016-08-29 LAB — POCT I-STAT 3, ART BLOOD GAS (G3+)
Acid-base deficit: 4 mmol/L — ABNORMAL HIGH (ref 0.0–2.0)
Acid-base deficit: 5 mmol/L — ABNORMAL HIGH (ref 0.0–2.0)
Bicarbonate: 24.3 mmol/L (ref 20.0–28.0)
Bicarbonate: 23.7 mmol/L (ref 20.0–28.0)
O2 SAT: 77 %
O2 Saturation: 65 %
PCO2 ART: 53.4 mmHg — AB (ref 32.0–48.0)
PCO2 ART: 58.4 mmHg — AB (ref 32.0–48.0)
PH ART: 7.26 — AB (ref 7.350–7.450)
PH ART: 7.214 — AB (ref 7.350–7.450)
TCO2: 26 mmol/L (ref 0–100)
TCO2: 26 mmol/L (ref 0–100)
pO2, Arterial: 37 mmHg — CL (ref 83.0–108.0)
pO2, Arterial: 50 mmHg — ABNORMAL LOW (ref 83.0–108.0)

## 2016-08-29 LAB — BLOOD GAS, ARTERIAL
ACID-BASE DEFICIT: 5.2 mmol/L — AB (ref 0.0–2.0)
Acid-base deficit: 5.6 mmol/L — ABNORMAL HIGH (ref 0.0–2.0)
BICARBONATE: 22 mmol/L (ref 20.0–28.0)
Bicarbonate: 21.8 mmol/L (ref 20.0–28.0)
DRAWN BY: 50522
Delivery systems: POSITIVE
Drawn by: 50522
Expiratory PAP: 8
FIO2: 80
INSPIRATORY PAP: 18
O2 CONTENT: 15 L/min
O2 SAT: 87.6 %
O2 Saturation: 80 %
PATIENT TEMPERATURE: 98.6
PCO2 ART: 61.3 mmHg — AB (ref 32.0–48.0)
PH ART: 7.18 — AB (ref 7.350–7.450)
PO2 ART: 52.9 mmHg — AB (ref 83.0–108.0)
PO2 ART: 63.5 mmHg — AB (ref 83.0–108.0)
Patient temperature: 98.6
pCO2 arterial: 62.7 mmHg — ABNORMAL HIGH (ref 32.0–48.0)
pH, Arterial: 7.167 — CL (ref 7.350–7.450)

## 2016-08-29 LAB — OSMOLALITY: Osmolality: 313 mOsm/kg — ABNORMAL HIGH (ref 275–295)

## 2016-08-29 LAB — GLUCOSE, CAPILLARY
GLUCOSE-CAPILLARY: 109 mg/dL — AB (ref 65–99)
Glucose-Capillary: 190 mg/dL — ABNORMAL HIGH (ref 65–99)
Glucose-Capillary: 197 mg/dL — ABNORMAL HIGH (ref 65–99)
Glucose-Capillary: 224 mg/dL — ABNORMAL HIGH (ref 65–99)

## 2016-08-29 LAB — BASIC METABOLIC PANEL
Anion gap: 12 (ref 5–15)
BUN: 89 mg/dL — ABNORMAL HIGH (ref 6–20)
CALCIUM: 8.7 mg/dL — AB (ref 8.9–10.3)
CO2: 24 mmol/L (ref 22–32)
Chloride: 90 mmol/L — ABNORMAL LOW (ref 101–111)
Creatinine, Ser: 3.72 mg/dL — ABNORMAL HIGH (ref 0.44–1.00)
GFR calc Af Amer: 12 mL/min — ABNORMAL LOW (ref >60)
GFR, EST NON AFRICAN AMERICAN: 10 mL/min — AB (ref >60)
GLUCOSE: 192 mg/dL — AB (ref 65–99)
Potassium: 5.1 mmol/L (ref 3.5–5.1)
Sodium: 126 mmol/L — ABNORMAL LOW (ref 135–145)

## 2016-08-29 LAB — APTT: aPTT: 113 seconds — ABNORMAL HIGH (ref 24–36)

## 2016-08-29 LAB — HEPARIN LEVEL (UNFRACTIONATED)

## 2016-08-29 LAB — LACTIC ACID, PLASMA: LACTIC ACID, VENOUS: 1.4 mmol/L (ref 0.5–1.9)

## 2016-08-29 LAB — PROCALCITONIN: PROCALCITONIN: 0.5 ng/mL

## 2016-08-29 MED ORDER — METHYLPREDNISOLONE SODIUM SUCC 40 MG IJ SOLR
40.0000 mg | Freq: Two times a day (BID) | INTRAMUSCULAR | Status: DC
  Administered 2016-08-29 – 2016-08-30 (×2): 40 mg via INTRAVENOUS
  Filled 2016-08-29 (×2): qty 1

## 2016-08-29 MED ORDER — SODIUM CHLORIDE 0.9 % IV SOLN
0.2000 ug/kg/h | INTRAVENOUS | Status: DC
  Administered 2016-08-29: 0.2 ug/kg/h via INTRAVENOUS
  Administered 2016-08-29: 0.4 ug/kg/h via INTRAVENOUS
  Administered 2016-08-29 – 2016-08-30 (×2): 0.5 ug/kg/h via INTRAVENOUS
  Administered 2016-08-30: 0.4 ug/kg/h via INTRAVENOUS
  Filled 2016-08-29 (×7): qty 2

## 2016-08-29 MED ORDER — IPRATROPIUM-ALBUTEROL 0.5-2.5 (3) MG/3ML IN SOLN
3.0000 mL | Freq: Four times a day (QID) | RESPIRATORY_TRACT | Status: DC
  Administered 2016-08-29 (×2): 3 mL via RESPIRATORY_TRACT
  Filled 2016-08-29 (×2): qty 3

## 2016-08-29 MED ORDER — HEPARIN (PORCINE) IN NACL 100-0.45 UNIT/ML-% IJ SOLN
1200.0000 [IU]/h | INTRAMUSCULAR | Status: DC
  Administered 2016-08-29: 1200 [IU]/h via INTRAVENOUS
  Filled 2016-08-29: qty 250

## 2016-08-29 MED ORDER — IPRATROPIUM BROMIDE 0.02 % IN SOLN
0.5000 mg | Freq: Four times a day (QID) | RESPIRATORY_TRACT | Status: DC
  Administered 2016-08-30 (×2): 0.5 mg via RESPIRATORY_TRACT
  Filled 2016-08-29 (×2): qty 2.5

## 2016-08-29 MED ORDER — LEVALBUTEROL HCL 0.63 MG/3ML IN NEBU
0.6300 mg | INHALATION_SOLUTION | Freq: Four times a day (QID) | RESPIRATORY_TRACT | Status: DC
  Administered 2016-08-30 (×2): 0.63 mg via RESPIRATORY_TRACT
  Filled 2016-08-29 (×2): qty 3

## 2016-08-29 MED ORDER — HALOPERIDOL LACTATE 5 MG/ML IJ SOLN
1.0000 mg | Freq: Once | INTRAMUSCULAR | Status: AC
  Administered 2016-08-29: 1 mg via INTRAVENOUS
  Filled 2016-08-29: qty 1

## 2016-08-29 MED ORDER — ETOMIDATE 2 MG/ML IV SOLN: 20.0000 mg | INTRAVENOUS | Status: DC

## 2016-08-29 MED ORDER — DILTIAZEM HCL 100 MG IV SOLR
5.0000 mg/h | INTRAVENOUS | Status: DC
  Administered 2016-08-29: 5 mg/h via INTRAVENOUS
  Filled 2016-08-29: qty 100

## 2016-08-29 MED ORDER — LEVOTHYROXINE SODIUM 100 MCG IV SOLR
25.0000 ug | Freq: Every day | INTRAVENOUS | Status: DC
  Administered 2016-08-29: 25 ug via INTRAVENOUS
  Filled 2016-08-29: qty 5

## 2016-08-29 MED ORDER — NOREPINEPHRINE BITARTRATE 1 MG/ML IV SOLN
0.0000 ug/min | INTRAVENOUS | Status: DC
  Administered 2016-08-29: 1 ug/min via INTRAVENOUS
  Filled 2016-08-29 (×2): qty 4

## 2016-08-29 MED ORDER — INSULIN ASPART 100 UNIT/ML ~~LOC~~ SOLN
2.0000 [IU] | SUBCUTANEOUS | Status: DC
Start: 2016-08-29 — End: 2016-08-30
  Administered 2016-08-29: 6 [IU] via SUBCUTANEOUS
  Administered 2016-08-29 (×2): 4 [IU] via SUBCUTANEOUS
  Administered 2016-08-30 (×2): 2 [IU] via SUBCUTANEOUS

## 2016-08-29 MED ORDER — METOPROLOL TARTRATE 5 MG/5ML IV SOLN: 2.5000 mg | Freq: Three times a day (TID) | INTRAVENOUS | Status: DC

## 2016-08-29 MED ORDER — HEPARIN (PORCINE) IN NACL 100-0.45 UNIT/ML-% IJ SOLN: 1050.0000 [IU]/h | INTRAMUSCULAR | Status: DC

## 2016-08-29 MED ORDER — FENTANYL CITRATE (PF) 100 MCG/2ML IJ SOLN: 50.0000 ug | INTRAMUSCULAR | Status: DC

## 2016-08-29 MED ORDER — FENTANYL 2500MCG IN NS 250ML (10MCG/ML) PREMIX INFUSION: 20.0000 ug/h | INTRAVENOUS | Status: DC

## 2016-08-29 MED ORDER — ROCURONIUM BROMIDE 50 MG/5ML IV SOLN
50.0000 mg | INTRAVENOUS | Status: DC
  Filled 2016-08-29: qty 5

## 2016-08-29 NOTE — Progress Notes (Signed)
eLink Physician-Brief Progress Note Patient Name: Kristy Mcdonald DOB: April 12, 1932 MRN: [id number]   Date of Service  08/29/2016  HPI/Events of Note  Notified by bedside nurse of borderline hypertension. Patient currently on heparin, Precedex, and diltiazem infusions. Telemetry reviewed showing intermittent sinus rhythm with what appears to be atrial  flutter.  Respiratory status has improved somewhat. Patient currently on Duonebs scheduled every 6 hours. Also has Lopressor IV scheduled every 8 hours as well as Solu-Medrol.  eICU Interventions  1. Continuing to monitor the patient on telemetry. 2. Continuing scheduled IV Lopressor 3. Holding diltiazem infusion 4. Monitoring vitals per unit protocol 5. Continuing Precedex and heparin infusions 6. Continuing noninvasive positive pressure ventilation 7. Holding on initiating low-dose peripheral Neo-Synephrine for vasopressor support 8. Discontinuing Duonebs 9. Starting Atrovent and Xopenex nebulized every 6 hours 10. Consider initiating amiodarone infusion if heart rhythm/rate escalates      Intervention Category Major Interventions: Hypotension - evaluation and management;Respiratory failure - evaluation and management;Arrhythmia - evaluation and management  Tera Partridge 08/29/2016, 9:39 PM

## 2016-08-29 NOTE — Progress Notes (Signed)
Warm blankets applied to patient as now no longer agitated post haldol. Bair hugger not available at this time.

## 2016-08-29 NOTE — Clinical Social Work Note (Signed)
CSW acknowledges SNF consult. Patient currently on 15 L HFNC. CSW will continue to follow progress and assess for SNF when appropriate.  Dayton Scrape, [place]

## 2016-08-29 NOTE — Progress Notes (Signed)
[place] for heparin (holding apixaban) Indication: atrial fibrillation  Allergies  Allergen Reactions  . Lyrica [Pregabalin] Other (See Comments)    Causes fluid retention around both eyes  . Nicotine Diarrhea  . Prednisone Other (See Comments)    Causes shakiness and moodiness    Patient Measurements: Height: 5\' 5"  (165.1 cm) Weight: 217 lb 6 oz (98.6 kg) IBW/kg (Calculated) : 57 Heparin Dosing Weight: 80 kg  Vital Signs: Temp: 97.2 F (36.2 C) (07/26 1957) Temp Source: Axillary (07/26 1957) BP: 108/88 (07/26 2000) Pulse Rate: 117 (07/26 2010)  Labs:  Recent Labs  08/27/16 0231 08/28/16 0513 08/29/16 0300 08/29/16 1933  HGB 11.0* 10.5*  --   --   HCT 35.0* 34.4*  --   --   PLT 342 383  --   --   APTT  --   --   --  113*  HEPARINUNFRC  --   --   --  >2.20*  CREATININE 2.99* 3.34* 3.72*  --     Estimated Creatinine Clearance: 13.8 mL/min (A) (by C-G formula based on SCr of 3.72 mg/dL (H)).  Assessment: 83 yo f with new onset afib - started initially on apixaban  Patient with worsening resp distress likely requiring intubation  Will need to hold apixaban and convert to IV heparin.  Initial heparin level is > 2.2 (as expected - falsely elevated with recent apixaban).  PTT is just slightly above goal range.  Goal of Therapy:  Heparin level 0.3-0.7 units/ml  PTT 66-102 Monitor platelets by anticoagulation protocol: Yes   Plan:  Decrease IV heparin to 1050 units/hr. Recheck PTT and heparin level in 8 hrs. Daily PTT/heparin level until correlating. CBC daily.  Uvaldo Rising, BCPS  Clinical Pharmacist Pager [phone]  08/29/2016 9:21 PM

## 2016-08-29 NOTE — Progress Notes (Signed)
eLink Physician-Brief Progress Note Patient Name: ANGIE PIERCEY DOB: [birth year] MRN: [id number]   Date of Service  08/29/2016  HPI/Events of Note  Nurse reports patient more altered than upon arrival. Camera shows patient on BiPAP with family at bedside. BiPAP rate set at 8 back up. spontaneous rate 13. BiPAP 18 & EPAP 5.   eICU Interventions  1. Spoke with bedside nurse to request respiratory therapist to adjust BiPAP 2. Order placed increase backup rate to 28 3. Repeat ABG and 25-30 minutes 4. Close monitoring for possible endotracheal intubation 5. Target saturation 88-94 %     Intervention Category Major Interventions: Respiratory failure - evaluation and management;Change in mental status - evaluation and management  Tera Partridge 08/29/2016, 3:20 PM

## 2016-08-29 NOTE — Progress Notes (Signed)
Blood gas collected was venous and will be redrawn by another RT. RN aware.

## 2016-08-29 NOTE — Progress Notes (Signed)
Patient alert to self only, Rectal Temp 95.4 but patient states she is hot and can't breathe. Unable to get ABG due to agitation. Safety Mitts applied. Patient on 15L HFNC at time of assessment. MD made aware, new orders received.

## 2016-08-29 NOTE — Progress Notes (Signed)
MD made aware ABG results: pH 7.18, CO2 61.3, PO2 52.9, Bicarb 22, awaiting new orders.

## 2016-08-29 NOTE — Progress Notes (Signed)
Admit: 08/09/2016 LOS: 36  83 yo F with CKD III-IV at baseline who presented with worsening SOB. Admitted with concern for LLL PNA, acute systolic CHF, hypoxic respiratory failure, atrial fibrillation with RVR, and AKI. She was started antibiotics, IV lasix, and spironolactone. Cardiology was consulted for rapid atrial flutter and cardizem was initiated. Unfortunately UOP declined and renal function has failed to improve. Nephrology consulted for further management.   Subjective:  BP persistently low, increasing oxygen requirement on 15L [place]. Worsening mental status s/p haldol this AM. ABG with worsening acidosis. PCCM consulted; plan for BiPAP and transfer to ICU.   07/25 0701 - 07/26 0700 In: 6 [I.V.:6] Out: 325 [Urine:325]  Filed Weights   08/27/16 0405 08/28/16 0500 08/29/16 0320  Weight: 205 lb 4 oz (93.1 kg) 218 lb 7.6 oz (99.1 kg) 217 lb 6 oz (98.6 kg)    Scheduled Meds: . amitriptyline  10 mg Oral QHS  . apixaban  2.5 mg Oral BID  . bisoprolol  5 mg Oral Daily  . diazepam  2.5 mg Oral QHS  . diltiazem  60 mg Oral Q6H  . insulin aspart  0-15 Units Subcutaneous TID WC  . insulin aspart  0-5 Units Subcutaneous QHS  . insulin glargine  10 Units Subcutaneous Daily  . levothyroxine  50 mcg Oral QAC breakfast  . mouth rinse  15 mL Mouth Rinse BID  . senna  1 tablet Oral Daily  . sodium chloride flush  3 mL Intravenous Q12H  . tiotropium  18 mcg Inhalation Daily   Continuous Infusions: . sodium chloride    . azithromycin Stopped (08/28/16 1706)  . cefTRIAXone (ROCEPHIN)  IV Stopped (08/28/16 1602)   PRN Meds:.sodium chloride, acetaminophen, HYDROcodone-acetaminophen, magic mouthwash w/lidocaine, ondansetron (ZOFRAN) IV, sodium chloride flush  Current Labs: reviewed    Physical Exam:  Blood pressure (!) 96/57, pulse (!) 37, temperature 97.6 F (36.4 C), temperature source Oral, resp. rate 15, height 5\' 5"  (1.651 m), weight 217 lb 6 oz (98.6 kg), SpO2 (!) 88 %. Gen: Sleeping  comfortably, NAD Heart: RRR Lungs: Bibasilar crackles, L>R Ext: Bilateral lower extremity edema  Skin: no rash   A 1. Acute on Chronic Renal Failure: Baseline CKD III - IV (Scr ~2.0) now with oliguria. Scr continues to rise, 3.7. Likely ATN in the setting of ongoing hypotension and afib w/ RVR. 2. Hyperkalemia: Initially resolved with kayexalate, now borderline 5.1. Will monitor for now, will likely need additional kayexalate tomorrow.  3. Hyponatremia: In the setting of acute renal failure/reduced GFR and acute dCHF. Likely hypotonic, hypervolemic. Now on fluid restriction.  4. Acute on Chronic Respiratory Failure: Multifactorial due to CAP, acute dCHF exacerbation, and underlying COPD. 5. Paroxsymal a-fib/a-flutter: Card followings, on PO cardizem. Spontaneously converted to sinus rhythm - no role for DCCV at this time. On reduced dose Eliquis, however if renal function continues to decline, will require transition to warfarin.   P 1. Continue to monitor renal function and volume status closely. No urgent need for CRRT at this time, but suspect renal function will continue to decline given ongoing hypotension. Discussed tenuous clinical status with husband and friends at bedside who currently wish to continue with aggressive care. We will continue to follow along for CRRT needs.  2. Will follow up AM labs. Strict I/Os, daily weights, avoid nephrotoxic agents.  3. Holding lasix and spironolactone given hypotension  4. Fluid restrict, 1.5L  5. Other issues per primary and cards.   Velna Ochs, M.D. - PGY2 Pager:  [phone] 08/29/2016, 7:56 AM    Recent Labs Lab 08/27/16 0231 08/28/16 0513 08/29/16 0300  NA 128* 130* 126*  K 5.5* 4.8 5.1  CL 92* 92* 90*  CO2 23 26 24   GLUCOSE 209* 119* 192*  BUN 59* 73* 89*  CREATININE 2.99* 3.34* 3.72*  CALCIUM 9.1 8.9 8.7*    Recent Labs Lab 08/26/16 0211 08/27/16 0231 08/28/16 0513  WBC 12.6* 12.9* 12.8*  HGB 11.4* 11.0* 10.5*  HCT  36.9 35.0* 34.4*  MCV 88.9 89.1 88.7  PLT 340 342 383

## 2016-08-29 NOTE — Progress Notes (Signed)
ABG attempted without success. Patient agitated and combative, completely uncooperative. Patient currently requiring 3 RN's and NA to contain at the moment.

## 2016-08-29 NOTE — Progress Notes (Addendum)
PROGRESS NOTE    Kristy Mcdonald  BUL:[id number] DOB: 02-Mar-1932 DOA: 08/05/2016 PCP: Lucianne Lei, MD   Brief Narrative: Kristy Mcdonald is a 83 y.o. chemotherapy she has COPD, chronic systolic and diastolic CHF, CKD stage IV, hypothyroidism, etc. fibrillation on expansion. She presented with concern for CHF exacerbation. She has required an increased amount of oxygen. No chest pain. Initially managed with diuresis which has been discontinued. Looking more like a primary lung etiology.    Assessment & Plan:   Principal Problem:   Acute on chronic respiratory failure with hypoxia (HCC) Active Problems:   Atrial fibrillation with RVR (HCC)   COPD (chronic obstructive pulmonary disease) (HCC)   Type 2 diabetes mellitus with retinopathy without macular edema, without long-term current use of insulin (HCC)   Hypothyroidism, acquired   Acute-on-chronic kidney injury ([place])   Acute diastolic CHF (congestive heart failure) ([place])   Community acquired pneumonia   Typical atrial flutter (HCC)   AKI (acute kidney injury) ([place])   Acute on chronic respiratory failure with hypoxia and hypercarbia Acute respiratory acidosis Initially thought to be secondary to heart failure, but she appears actually hypovolemic. Still requiring significant amounts of oxygen up to 15L [place] and now with lethargy. She uses 2 L nasal cannula at home. -Wean oxygen to home -obtained ABG and significant for hypercarbia and acidemia; CCM consult for intubation -CT chest w/o contrast ordered, but may need to be placed on hold secondary to respiratory decline. If negative, would get a d-dimer and subsequent V/Q scan to rule out PE; patient already on anticoagulation.  Acute diastolic heart failure Echo from April significant for an EF of 50% which is improved to 60% on recent echo on July 23. Recent echo significant for grade 1 diastolic dysfunction. Does not appear to be contributing to respiratory status. Diuresis  held in setting of hypotension and acute kidney injury. -Cardiology recommendations  Atrial fibrillation with RVR CHA2DS2-VASc Score is 5. Patient is currently on anticoagulation. Continues to be in atrial flutter on personal review of telemetry. Rate controlled. -Continue Eliquis -Continue Cardizem -Cardiology recommendations  Left lower lobe pneumonia Concern for possible consolidation in left lower lung. -Continue azithromycin and ceftriaxone -CT chest as mentioned above  Depression Stable -Continue Elavil  Diabetes mellitus, type II, insulin-dependent -Continue sliding scale insulin -Continue Lantus  Hyponatremia Likely secondary to poor oral intake. Has been somewhat stable but low. -urine/serum osmolality -urine sodium/creatinine  Acute kidney injury on CKD 4 Diabetic nephropathy Creatinine continues to worsen in the setting of slight hypotension -Nephrology recommendations -may need pressors to keep pressure up especially in the setting of acute respiratory decline -Lasix and Aldactone on hold secondary to hypotension  Hypothyroidism -Continue Synthroid  COPD Patient was treated initially with steroid burst. Does not appear to be in exacerbation. -continue bronchodilators   DVT prophylaxis: Eliquis Code Status: Full code Family Communication: None at bedside Disposition Plan: Transfer to ICU   Consultants:   Cardiology  Nephrology  CCM  Procedures:   None  Antimicrobials:  Ceftriaxone (7/22>>  Azithromycin (7/22>>    Subjective: Lethargic. Unable to appropriately answer questions.  Objective: Vitals:   08/29/16 0733 08/29/16 0838 08/29/16 1000 08/29/16 1011  BP: (!) 96/57  (!) 94/59   Pulse:   75 62  Resp:   14 15  Temp:  (!) 95.4 F (35.2 C)    TempSrc:  Rectal    SpO2:   100% 94%  Weight:      Height:  Intake/Output Summary (Last 24 hours) at 08/29/16 1050 Last data filed at 08/29/16 1030  Gross per 24 hour  Intake               603 ml  Output              325 ml  Net              278 ml   Filed Weights   08/27/16 0405 08/28/16 0500 08/29/16 0320  Weight: 93.1 kg (205 lb 4 oz) 99.1 kg (218 lb 7.6 oz) 98.6 kg (217 lb 6 oz)    Examination:  General exam: Appears calm and comfortable  Respiratory system: Clear to auscultation. Respiratory effort normal. No wheezing.  Cardiovascular system: S1 & S2 heard, normal rate, regular rhythm. No murmurs. Gastrointestinal system: Abdomen is nondistended, soft and nontender. Normal bowel sounds heard. Central nervous system: Lethargic and not oriented. Extremities: No edema. No calf tenderness Skin: No cyanosis. No rashes Psychiatry: Judgement and insight appear normal. Mood & affect appropriate.     Data Reviewed: I have personally reviewed following labs and imaging studies  CBC:  Recent Labs Lab 08/07/2016 1435 08/26/16 0211 08/27/16 0231 08/28/16 0513  WBC 15.9* 12.6* 12.9* 12.8*  HGB 11.6* 11.4* 11.0* 10.5*  HCT 37.0 36.9 35.0* 34.4*  MCV 88.5 88.9 89.1 88.7  PLT 402* 340 342 323   Basic Metabolic Panel:  Recent Labs Lab 08/29/2016 1435 08/26/16 0211 08/27/16 0231 08/28/16 0513 08/29/16 0300  NA 130* 128* 128* 130* 126*  K 4.8 5.0 5.5* 4.8 5.1  CL 91* 94* 92* 92* 90*  CO2 24 23 23 26 24   GLUCOSE 270* 230* 209* 119* 192*  BUN 47* 50* 59* 73* 89*  CREATININE 2.52* 2.55* 2.99* 3.34* 3.72*  CALCIUM 10.0 9.0 9.1 8.9 8.7*   GFR: Estimated Creatinine Clearance: 13.8 mL/min (A) (by C-G formula based on SCr of 3.72 mg/dL (H)). Liver Function Tests: No results for input(s): AST, ALT, ALKPHOS, BILITOT, PROT, ALBUMIN in the last 168 hours. No results for input(s): LIPASE, AMYLASE in the last 168 hours. No results for input(s): AMMONIA in the last 168 hours. Coagulation Profile: No results for input(s): INR, PROTIME in the last 168 hours. Cardiac Enzymes: No results for input(s): CKTOTAL, CKMB, CKMBINDEX, TROPONINI in the last 168  hours. BNP (last 3 results)  Recent Labs  06/04/16 1014  PROBNP 400   HbA1C: No results for input(s): HGBA1C in the last 72 hours. CBG:  Recent Labs Lab 08/28/16 1222 08/28/16 1616 08/28/16 2153 08/29/16 0732 08/29/16 1019  GLUCAP 196* 218* 222* 190* 197*   Lipid Profile: No results for input(s): CHOL, HDL, LDLCALC, TRIG, CHOLHDL, LDLDIRECT in the last 72 hours. Thyroid Function Tests: No results for input(s): TSH, T4TOTAL, FREET4, T3FREE, THYROIDAB in the last 72 hours. Anemia Panel: No results for input(s): VITAMINB12, FOLATE, FERRITIN, TIBC, IRON, RETICCTPCT in the last 72 hours. Sepsis Labs:  Recent Labs Lab 08/19/2016 1626  LATICACIDVEN 1.84    Recent Results (from the past 240 hour(s))  Blood Culture (routine x 2)     Status: None (Preliminary result)   Collection Time: 08/04/2016  4:08 PM  Result Value Ref Range Status   Specimen Description BLOOD LEFT ANTECUBITAL  Final   Special Requests   Final    BOTTLES DRAWN AEROBIC AND ANAEROBIC Blood Culture adequate volume   Culture NO GROWTH 3 DAYS  Final   Report Status PENDING  Incomplete  Blood Culture (routine x 2)  Status: None (Preliminary result)   Collection Time: 08/09/2016  4:12 PM  Result Value Ref Range Status   Specimen Description BLOOD LEFT FOREARM  Final   Special Requests   Final    BOTTLES DRAWN AEROBIC ONLY Blood Culture adequate volume   Culture NO GROWTH 3 DAYS  Final   Report Status PENDING  Incomplete  MRSA PCR Screening     Status: None   Collection Time: 08/31/2016  6:48 PM  Result Value Ref Range Status   MRSA by PCR NEGATIVE NEGATIVE Final    Comment:        The GeneXpert MRSA Assay (FDA approved for NASAL specimens only), is one component of a comprehensive MRSA colonization surveillance program. It is not intended to diagnose MRSA infection nor to guide or monitor treatment for MRSA infections.          Radiology Studies: Dg Chest Port 1 View  Result Date:  08/28/2016 CLINICAL DATA:  Shortness of breath and cough today. EXAM: PORTABLE CHEST 1 VIEW COMPARISON:  08/29/2016 FINDINGS: Increased densities in the left upper chest and left suprahilar region. Increased densities at both lung bases are suggestive for consolidation and pleural fluid. Again noted is cardiomegaly. Atherosclerotic calcifications at the aortic arch. Negative for pneumothorax. Increased densities in the right apex region. IMPRESSION: Increased basilar chest densities and markedly increased densities in the left upper lung/left suprahilar region. The left upper chest disease may represent atelectasis but infection cannot be excluded. Increased basilar chest densities are suggestive for pleural effusions with consolidation. Cardiomegaly. Electronically Signed   By: Markus Daft M.D.   On: 08/28/2016 09:20        Scheduled Meds: . amitriptyline  10 mg Oral QHS  . insulin aspart  2-6 Units Subcutaneous Q4H  . insulin glargine  10 Units Subcutaneous Daily  . levothyroxine  50 mcg Oral QAC breakfast  . mouth rinse  15 mL Mouth Rinse BID  . metoprolol tartrate  2.5 mg Intravenous Q8H  . senna  1 tablet Oral Daily  . sodium chloride flush  3 mL Intravenous Q12H  . tiotropium  18 mcg Inhalation Daily   Continuous Infusions: . sodium chloride    . azithromycin Stopped (08/28/16 1706)  . cefTRIAXone (ROCEPHIN)  IV Stopped (08/28/16 1602)  . diltiazem (CARDIZEM) infusion    . heparin 1,200 Units/hr (08/29/16 1030)     LOS: 4 days     Cordelia Poche, MD [facility] 08/29/2016, 10:50 AM Pager: [phone]  If 7PM-7AM, please contact night-coverage [email or web address] Password [email or web address] 08/29/2016, 10:50 AM

## 2016-08-29 NOTE — Progress Notes (Signed)
Progress Note  Patient Name: Kristy Mcdonald Date of Encounter: 08/29/2016  Primary Cardiologist: Dr Meda Coffee  Subjective   Pt combative during the night, now sedated  Inpatient Medications    Scheduled Meds: . amitriptyline  10 mg Oral QHS  . apixaban  2.5 mg Oral BID  . bisoprolol  5 mg Oral Daily  . diazepam  2.5 mg Oral QHS  . diltiazem  60 mg Oral Q6H  . insulin aspart  0-15 Units Subcutaneous TID WC  . insulin aspart  0-5 Units Subcutaneous QHS  . insulin glargine  10 Units Subcutaneous Daily  . levothyroxine  50 mcg Oral QAC breakfast  . mouth rinse  15 mL Mouth Rinse BID  . senna  1 tablet Oral Daily  . sodium chloride flush  3 mL Intravenous Q12H  . tiotropium  18 mcg Inhalation Daily   Continuous Infusions: . sodium chloride    . azithromycin Stopped (08/28/16 1706)  . cefTRIAXone (ROCEPHIN)  IV Stopped (08/28/16 1602)   PRN Meds: sodium chloride, acetaminophen, HYDROcodone-acetaminophen, magic mouthwash w/lidocaine, ondansetron (ZOFRAN) IV, sodium chloride flush   Vital Signs    Vitals:   08/29/16 0320 08/29/16 0634 08/29/16 0733 08/29/16 0838  BP:  100/62 (!) 96/57   Pulse:  (!) 37    Resp:  15    Temp: 97.6 F (36.4 C)   (!) 95.4 F (35.2 C)  TempSrc: Oral   Rectal  SpO2:  (!) 88%    Weight: 217 lb 6 oz (98.6 kg)     Height:        Intake/Output Summary (Last 24 hours) at 08/29/16 0921 Last data filed at 08/28/16 2158  Gross per 24 hour  Intake                3 ml  Output              325 ml  Net             -322 ml   Filed Weights   08/27/16 0405 08/28/16 0500 08/29/16 0320  Weight: 205 lb 4 oz (93.1 kg) 218 lb 7.6 oz (99.1 kg) 217 lb 6 oz (98.6 kg)    Telemetry    Atrial flutter with variable VR-rate 80-100 - Personally Reviewed  ECG    08/28/16- Atrial flutter with variable VR, low voltage, poor anterior RW progression - Personally Reviewed  Physical Exam   GEN: Pale, obese, on O2, NAD Neck: No JVD Cardiac: irreg irreg,  no murmurs, rubs, or gallops.  Respiratory: absent breath sounds 1/2 bilaterally  GI: Soft, nontender, non-distended  MS: 1+ LE edema; No deformity. Neuro:  Sedated Psych: Normal affect   Labs    Chemistry  Recent Labs Lab 08/27/16 0231 08/28/16 0513 08/29/16 0300  NA 128* 130* 126*  K 5.5* 4.8 5.1  CL 92* 92* 90*  CO2 23 26 24   GLUCOSE 209* 119* 192*  BUN 59* 73* 89*  CREATININE 2.99* 3.34* 3.72*  CALCIUM 9.1 8.9 8.7*  GFRNONAA 14* 12* 10*  GFRAA 16* 14* 12*  ANIONGAP 13 12 12      Hematology  Recent Labs Lab 08/26/16 0211 08/27/16 0231 08/28/16 0513  WBC 12.6* 12.9* 12.8*  RBC 4.15 3.93 3.88  HGB 11.4* 11.0* 10.5*  HCT 36.9 35.0* 34.4*  MCV 88.9 89.1 88.7  MCH 27.5 28.0 27.1  MCHC 30.9 31.4 30.5  RDW 13.3 13.3 13.5  PLT 340 342 383    Cardiac EnzymesNo results for  input(s): TROPONINI in the last 168 hours.   Recent Labs Lab 08/21/2016 1448  TROPIPOC 0.04     BNP  Recent Labs Lab 08/14/2016 1435 08/27/16 0231  BNP 389.5* 174.8*     DDimer No results for input(s): DDIMER in the last 168 hours.   Radiology    Dg Chest Port 1 View  Result Date: 08/28/2016 CLINICAL DATA:  Shortness of breath and cough today. EXAM: PORTABLE CHEST 1 VIEW COMPARISON:  08/29/2016 FINDINGS: Increased densities in the left upper chest and left suprahilar region. Increased densities at both lung bases are suggestive for consolidation and pleural fluid. Again noted is cardiomegaly. Atherosclerotic calcifications at the aortic arch. Negative for pneumothorax. Increased densities in the right apex region. IMPRESSION: Increased basilar chest densities and markedly increased densities in the left upper lung/left suprahilar region. The left upper chest disease may represent atelectasis but infection cannot be excluded. Increased basilar chest densities are suggestive for pleural effusions with consolidation. Cardiomegaly. Electronically Signed   By: Markus Daft M.D.   On: 08/28/2016  09:20    Cardiac Studies   Echo 08/26/16- Study Conclusions  - Left ventricle: The cavity size was normal. Wall thickness was   increased in a pattern of mild LVH. Systolic function was normal.   The estimated ejection fraction was in the range of 60% to 65%.   Incoordinate septal motion. Doppler parameters are consistent   with abnormal left ventricular relaxation (grade 1 diastolic   dysfunction). The E/e&' ratio is between 8-15, suggesting   indeterminate LV filling pressure. - Mitral valve: Mildly thickened leaflets . There was trivial   regurgitation. - Left atrium: The atrium was normal in size. - Inferior vena cava: The vessel was dilated. The respirophasic   diameter changes were blunted (< 50%), consistent with elevated   central venous pressure.  Impressions:  - LVEF 60-65%, mild LVH, incoordinate septal motion, grade 1 DD,   indeterminate LV filling pressure, trivial MR, normal LA size,   dilated IVC. Compared to a prior echo in 05/2016, the LVEF has   improved.  Patient Profile     83 y.o. female admitted with acute on chronic respiratory failure 08/22/2016, found to be in recurrent atrial flutter.   Assessment & Plan    1.  Acute on chronic resp failure/CAP/COPD: She has deteriorated last 24 hrs  2.  PAF RVR: Pt presented with rapid aflutter in the setting of #1.  She initially converted but went back into Aflutter.  Rates currently 80-100. On increased PO dilt 60 q6h.  Suspect atrial arrhythmias will cont to be an issue in setting of acute resp illness.  May need to consider antiarrhythmic therapy.  No role for DCCV @ this ponit.  Cont eliquis @ reduced dose for now.  If renal fxn worsens, with drop of CrCl <15, would need to transition to warfarin.  3.  Acute on chronic diastolic CHF:  EF 41-96% 03/28/9796. +1.7L since admission.  Not sure Wt is reliable- 200 to 218 lbs.  Not significantly volume overloaded on exam.  BNP only mildly elevated @ 174.8 in setting of  renal failure.  Follow for now.  4.  AKI on CKD IV:  Diuretics on hold.  Creat sl worse this am.  Nephrology following.   5.  Tob Abuse:  Cessation advised.  6.  Hyperkalemia: mild - in setting of worsening renal fxn and Aldactone which has been stopped.   Plan: This appears to be primarily a pulmonary problem. She  is no longer getting PO meds. Suspect she will need CCM consult and intubation- will need to decide if she is a candidate for dialysis as she appears to be headed that way.   Change to IV Diltiazem and IV lopressor. Eliquis held- start heparin.    Angelena Form, PA-C  08/29/2016, 9:21 AM     Patient seen and examined. Agree with assessment and plan. Continue to be in atrial flutter. Rate in the 80s.  If HR increasess can transition back to iv diltiazem since not taking oral meds. BP 98/60 presently.  Renal function is worse; now  stage 5 with GFR 10.  Eliquis held; to start heparin. Hyponatremic with Na 126 today; K 5.1. Renal is following. Currently on BiPAP.   For transfer to [facility]. Mild edema in mid pretibial area. EF on echo 60 - 65% with Gr 1 diastolic dysfunction.    Troy Sine, MD, [facility] 08/29/2016 10:46 AM

## 2016-08-29 NOTE — Progress Notes (Signed)
ANTICOAGULATION CONSULT NOTE - Initial Consult  Pharmacy Consult for heparin (holding apixaban) Indication: atrial fibrillation  Allergies  Allergen Reactions  . Lyrica [Pregabalin] Other (See Comments)    Causes fluid retention around both eyes  . Nicotine Diarrhea  . Prednisone Other (See Comments)    Causes shakiness and moodiness    Patient Measurements: Height: 5\' 5"  (165.1 cm) Weight: 217 lb 6 oz (98.6 kg) IBW/kg (Calculated) : 57 Heparin Dosing Weight: 80 kg  Vital Signs: Temp: 95.4 F (35.2 C) (07/26 0838) Temp Source: Rectal (07/26 0838) BP: 96/57 (07/26 0733) Pulse Rate: 37 (07/26 0634)  Labs:  Recent Labs  08/27/16 0231 08/28/16 0513 08/29/16 0300  HGB 11.0* 10.5*  --   HCT 35.0* 34.4*  --   PLT 342 383  --   CREATININE 2.99* 3.34* 3.72*    Estimated Creatinine Clearance: 13.8 mL/min (A) (by C-G formula based on SCr of 3.72 mg/dL (H)).  Assessment: 83 yo f with new onset afib - started initially on apixaban  Patient with worsening resp distress likely requiring intubation  Will need to hold apixaban and covert to IV heparin  Renal: CKD stage 3 - AKI SCr-3.72  Heme/Onc: H&H 10.5/34.4, Plt 383  Goal of Therapy:  Heparin level 0.3-0.7 units/ml Monitor platelets by anticoagulation protocol: Yes   Plan:  Dc apixaban - last dose 2200 7/25  Start hep gtt no bolus @ 1200 units/hr Initial hep lvl/aptt at 1800 Daily HL, aPTT, CBC  Levester Fresh, PharmD, BCPS, BCCCP Clinical Pharmacist Clinical phone for 08/29/2016 from 7a-3:30p: [phone] If after 3:30p, please call main pharmacy at: [phone] 08/29/2016 10:07 AM

## 2016-08-29 NOTE — Consult Note (Signed)
PULMONARY / CRITICAL CARE MEDICINE   Name: Kristy Mcdonald MRN: [id number] DOB: 1932/05/05    ADMISSION DATE:  08/27/2016 CONSULTATION DATE:  08/29/2016  REFERRING MD:  Dr. Lonny Prude  CHIEF COMPLAINT:  AMS  HISTORY OF PRESENT ILLNESS:   83 year old female with PMH of diastolic HF, Chronic Edema, CKD stage 4, COPD, DM, HTN, Hypokalemia, PAF on eliquis, Chronic Hypoxic Respiratory Failure on 2L [place] at home, and Tobacco Abuse, Recent treatment of COPD exacerbation one month ago with steroids and z-pac   Presents 7/22 with reported several days of dyspnea (in which worsened with exertion) and leg swelling/weight gain. CXR with left lower lobe PNA, WBC 15, BNP elevated. Admitted to family medicine service. Throughout stay has become increasingly confused and hypoxic requiring increased oxygen requirements and sedative medications. This morning patient was on 15L [place], alert, however stating she could not breath. Was given PRN dose of Haldol and an ABG was obtained. ABG of 7.180/61.3/52.9. PCCM asked to consult.   PAST MEDICAL HISTORY :  She  has a past medical history of Chronic diastolic CHF (congestive heart failure) ([place]); Chronic edema; Chronic respiratory failure (HCC); CKD (chronic kidney disease), stage IV (HCC); COPD (chronic obstructive pulmonary disease) ([place]); Diabetes mellitus ([place]); Hypertension; Hypokalemia; Hypothyroidism; Lower extremity edema; PAF (paroxysmal atrial fibrillation) ([place]); and Tobacco abuse.  PAST SURGICAL HISTORY: She  has no past surgical history on file.  Allergies  Allergen Reactions  . Lyrica [Pregabalin] Other (See Comments)    Causes fluid retention around both eyes  . Nicotine Diarrhea  . Prednisone Other (See Comments)    Causes shakiness and moodiness    No current facility-administered medications on file prior to encounter.    Current Outpatient Prescriptions on File Prior to Encounter  Medication Sig  . albuterol (PROVENTIL HFA;VENTOLIN HFA) 108  (90 BASE) MCG/ACT inhaler Inhale 2 puffs into the lungs every 6 (six) hours as needed for wheezing or shortness of breath.  Marland Kitchen amitriptyline (ELAVIL) 10 MG tablet Take 10 mg by mouth at bedtime.  Marland Kitchen apixaban (ELIQUIS) 2.5 MG TABS tablet Take 1 tablet (2.5 mg total) by mouth 2 (two) times daily.  . diazepam (VALIUM) 5 MG tablet Take 2.5 mg by mouth at bedtime.   Marland Kitchen diltiazem (CARDIZEM CD) 120 MG 24 hr capsule Take 1 capsule (120 mg total) by mouth daily.  . furosemide (LASIX) 40 MG tablet Take 1 tablet (40 mg total) by mouth daily.  Marland Kitchen glimepiride (AMARYL) 4 MG tablet Take 2 tablets (8 mg total) by mouth daily with breakfast.  . levothyroxine (SYNTHROID, LEVOTHROID) 50 MCG tablet Take 50 mcg by mouth daily before breakfast.  . potassium chloride SA (KLOR-CON M20) 20 MEQ tablet Take 1 tablet (20 mEq total) by mouth daily.  Marland Kitchen spironolactone (ALDACTONE) 25 MG tablet Take 1 tablet (25 mg total) by mouth daily.  Marland Kitchen tiotropium (SPIRIVA HANDIHALER) 18 MCG inhalation capsule Place 18 mcg into inhaler and inhale daily.    FAMILY HISTORY:  Her indicated that her mother is deceased. She indicated that her father is deceased. She indicated that the status of her sister is unknown. She indicated that her maternal grandmother is deceased. She indicated that her maternal grandfather is deceased. She indicated that her paternal grandmother is deceased. She indicated that her paternal grandfather is deceased.    SOCIAL HISTORY: She  reports that she has been smoking.  She has never used smokeless tobacco. She reports that she does not drink alcohol or use drugs.  REVIEW  OF SYSTEMS:   Unable to review as patient is encephalopathic   SUBJECTIVE:  Lethargic, Currently being placed on BiPAP, family at bedside   VITAL SIGNS: BP (!) 96/57 (BP Location: Right Arm)   Pulse (!) 37   Temp (!) 95.4 F (35.2 C) (Rectal)   Resp 15   Ht 5\' 5"  (1.651 m)   Wt 98.6 kg (217 lb 6 oz)   SpO2 (!) 88%   BMI 36.17 kg/m    HEMODYNAMICS:    VENTILATOR SETTINGS:    INTAKE / OUTPUT: I/O last 3 completed shifts: In: 50 [P.O.:200; I.V.:9] Out: 425 [Urine:425]  PHYSICAL EXAMINATION: General:  Adult female, no distress  Neuro:  Lethargic, moves extremities to physical stimulation, does not follow commands  HEENT:  Dry MM Cardiovascular:  Irregular, no MRG Lungs:  Diminished breath sounds to bases, non-labored  Abdomen:  Obese, active bowel sounds  Musculoskeletal:  +1 pedal edema  Skin:  Warm, dry, intact   LABS:  BMET  Recent Labs Lab 08/27/16 0231 08/28/16 0513 08/29/16 0300  NA 128* 130* 126*  K 5.5* 4.8 5.1  CL 92* 92* 90*  CO2 23 26 24   BUN 59* 73* 89*  CREATININE 2.99* 3.34* 3.72*  GLUCOSE 209* 119* 192*    Electrolytes  Recent Labs Lab 08/27/16 0231 08/28/16 0513 08/29/16 0300  CALCIUM 9.1 8.9 8.7*    CBC  Recent Labs Lab 08/26/16 0211 08/27/16 0231 08/28/16 0513  WBC 12.6* 12.9* 12.8*  HGB 11.4* 11.0* 10.5*  HCT 36.9 35.0* 34.4*  PLT 340 342 383    Coag's No results for input(s): APTT, INR in the last 168 hours.  Sepsis Markers  Recent Labs Lab 08/05/2016 1626  LATICACIDVEN 1.84    ABG  Recent Labs Lab 08/29/16 0833  PHART 7.180*  PCO2ART 61.3*  PO2ART 52.9*    Liver Enzymes No results for input(s): AST, ALT, ALKPHOS, BILITOT, ALBUMIN in the last 168 hours.  Cardiac Enzymes No results for input(s): TROPONINI, PROBNP in the last 168 hours.  Glucose  Recent Labs Lab 08/27/16 2132 08/28/16 0719 08/28/16 1222 08/28/16 1616 08/28/16 2153 08/29/16 0732  GLUCAP 235* 144* 196* 218* 222* 190*    Imaging No results found.   STUDIES:  CXR 7/22 > Enlargement of cardiac silhouette, Atelectasis vs consolidation LLL CXR 7/25 > Increased densities in the left upper chest and left suprahilar region, increased densities at both lung bases are suggestive for consolidation and pleural fluid  CULTURES: Blood 7/22 > Negative  MRSA by PCR 7/22  > Negative  Sputum  >>   ANTIBIOTICS: Azithromycin 7/22 >> Ceftriaxone 7/22 >>   SIGNIFICANT EVENTS: 7/22 > Presents to ED with Dyspnea  7/26 > Transferred to ICU   LINES/TUBES: PIV   DISCUSSION: 83 year old female presents to ED on 7/22 with dyspnea, diagnosised with CAP vs Acute HF. Stay complicated by progressive CKD. Throughout stay has become progressively confused and hypoxic, this AM required dose of haldol. ABG of 7.180/61.3/52.9. PCCM asked to transfer to ICU.   ASSESSMENT / PLAN:  PULMONARY A: Acute on Chronic Hypoxic/Hypercarbic Respiratory Failure in setting of CAP +/- AECOPD Possible LLL PNA  H/O Tobacco Abuse   P:   Maintain Oxygen >88 (home 2L)  BiPAP PRN Monitor for need for intubation > currently tolerating BiPAP  Trend ABG/CXR  Pulmonary Hygiene  CT Chest ordered previously however on hold due to instability   CARDIOVASCULAR A:  Acute on Chronic Systolic/Diastolic HF A.Fib with RVR P:  Cardiac  Monitoring Cardiology Following  Maintain MAP >65  Hold Xarelto > Start Heparin Gtt  Continue Scheduled Metoprolol 2.5 q8h per cardiology  Cardizem gtt currently off due to HR being WNL   RENAL A:   Acute on Chronic Kidney Diease Stage IV secondary to ATN secondary to hypotension? Hyponatremia  Hyperkalemia s/p kayexalate  P:   Trend BMP Nephrology Following  Avoid Nephrotoxic Medications  Continue to hold Lasix and Aldactone  Continue Fluid Restriction of 1.5L   GASTROINTESTINAL A:   No issues  P:   NPO PPI  HEMATOLOGIC A:   Anticoagulation needs for A.Fib P:  Trend CBC  Heparin gtt SCDS   INFECTIOUS A:   CAP +/-AECOPD  P:   Trend WBC and Fever Curve Trend PCT and Lactic Acid Follow Culture Data Continue Antibiotics   ENDOCRINE A:   DM 2    Hypothyroidism   P:   Trend Glucose  SSI Continue Synthroid   NEUROLOGIC A:   Acute Metabolic vs Polypharmacy vs [place]ic Encephalopathy  -Has had reported confusion, given haldol  7/26 H/O Depression, Diabetic Nephropathy  P:   Monitor Hold Sedative Medications    FAMILY  - Updates: Husband at bedside. States wife would be okay with short term ventilator  - Inter-disciplinary family meet or Palliative Care meeting due by:  09/05/2016   CC Time: 2 minutes  Hayden Pedro, AGACNP-BC [facility]  Pgr: [phone]  PCCM Pgr: [phone]

## 2016-08-30 ENCOUNTER — Inpatient Hospital Stay (HOSPITAL_COMMUNITY): Payer: Medicare Other

## 2016-08-30 DIAGNOSIS — I5031 Acute diastolic (congestive) heart failure: Secondary | ICD-10-CM

## 2016-08-30 LAB — CBC
HEMATOCRIT: 35.8 % — AB (ref 36.0–46.0)
HEMOGLOBIN: 11.5 g/dL — AB (ref 12.0–15.0)
MCH: 28.3 pg (ref 26.0–34.0)
MCHC: 32.1 g/dL (ref 30.0–36.0)
MCV: 88.2 fL (ref 78.0–100.0)
Platelets: 346 10*3/uL (ref 150–400)
RBC: 4.06 MIL/uL (ref 3.87–5.11)
RDW: 13.9 % (ref 11.5–15.5)
WBC: 12 10*3/uL — ABNORMAL HIGH (ref 4.0–10.5)

## 2016-08-30 LAB — BASIC METABOLIC PANEL
ANION GAP: 17 — AB (ref 5–15)
BUN: 103 mg/dL — ABNORMAL HIGH (ref 6–20)
CHLORIDE: 90 mmol/L — AB (ref 101–111)
CO2: 18 mmol/L — AB (ref 22–32)
CREATININE: 4.15 mg/dL — AB (ref 0.44–1.00)
Calcium: 8.3 mg/dL — ABNORMAL LOW (ref 8.9–10.3)
GFR calc non Af Amer: 9 mL/min — ABNORMAL LOW (ref >60)
GFR, EST AFRICAN AMERICAN: 11 mL/min — AB (ref >60)
Glucose, Bld: 123 mg/dL — ABNORMAL HIGH (ref 65–99)
POTASSIUM: 5.7 mmol/L — AB (ref 3.5–5.1)
Sodium: 125 mmol/L — ABNORMAL LOW (ref 135–145)

## 2016-08-30 LAB — BLOOD GAS, ARTERIAL
ACID-BASE DEFICIT: 4.5 mmol/L — AB (ref 0.0–2.0)
Bicarbonate: 21.9 mmol/L (ref 20.0–28.0)
Delivery systems: POSITIVE
Drawn by: 25231
Expiratory PAP: 8
FIO2: 50
INSPIRATORY PAP: 16
LHR: 8 {breaths}/min
O2 SAT: 86.8 %
PATIENT TEMPERATURE: 98.6
PCO2 ART: 54.9 mmHg — AB (ref 32.0–48.0)
PO2 ART: 60.3 mmHg — AB (ref 83.0–108.0)
pH, Arterial: 7.226 — ABNORMAL LOW (ref 7.350–7.450)

## 2016-08-30 LAB — CULTURE, BLOOD (ROUTINE X 2)
CULTURE: NO GROWTH
Culture: NO GROWTH
SPECIAL REQUESTS: ADEQUATE
SPECIAL REQUESTS: ADEQUATE

## 2016-08-30 LAB — GLUCOSE, CAPILLARY
GLUCOSE-CAPILLARY: 180 mg/dL — AB (ref 65–99)
Glucose-Capillary: 121 mg/dL — ABNORMAL HIGH (ref 65–99)
Glucose-Capillary: 130 mg/dL — ABNORMAL HIGH (ref 65–99)
Glucose-Capillary: 119 mg/dL — ABNORMAL HIGH (ref 65–99)

## 2016-08-30 LAB — HEPARIN LEVEL (UNFRACTIONATED)

## 2016-08-30 LAB — MAGNESIUM: Magnesium: 2.5 mg/dL — ABNORMAL HIGH (ref 1.7–2.4)

## 2016-08-30 LAB — APTT: APTT: 33 s (ref 24–36)

## 2016-08-30 LAB — PROCALCITONIN: Procalcitonin: 0.63 ng/mL

## 2016-08-30 LAB — PHOSPHORUS: PHOSPHORUS: 7.6 mg/dL — AB (ref 2.5–4.6)

## 2016-08-30 MED ORDER — MORPHINE BOLUS VIA INFUSION
5.0000 mg | INTRAVENOUS | Status: DC | PRN
  Administered 2016-08-30: 5 mg via INTRAVENOUS
  Filled 2016-08-30: qty 20

## 2016-08-30 MED ORDER — SODIUM CHLORIDE 0.9 % IV SOLN
10.0000 mg/h | INTRAVENOUS | Status: DC
  Administered 2016-08-30: 10 mg/h via INTRAVENOUS
  Filled 2016-08-30: qty 10

## 2016-08-31 NOTE — Progress Notes (Signed)
Patient noted no pulse and respiration, another RN called to verify,pronounced death at 07-May-2138.Family at beside. MD notified and CDS referral updated time of death. Bed control notified and funeral home. Body transferred to the morgue as funeral home not able to pick up body tonight.

## 2016-09-02 ENCOUNTER — Telehealth: Payer: Self-pay

## 2016-09-02 NOTE — Telephone Encounter (Signed)
On 09/02/2016 I received a death certificate from [facility] (faxed). The death certificate is for cremation. The patient is a patient of Doctor Titus Mould. The death certificate will be taken to Zacarias Pontes ([address]) this pm for signature.  On 2016/09/23 I received the death certificate back from Doctor [place]. I got the death certificate ready and faxed the d/c to the funeral home per the funeral home request.

## 2016-09-03 ENCOUNTER — Telehealth: Payer: Self-pay

## 2016-09-03 NOTE — Telephone Encounter (Signed)
On 09/03/2016 I received a death certificate from [facility] (original). The death certificate is for cremation. The patient is a patient of Doctor Titus Mould. The death certificate will be taken to Zacarias Pontes ([address]) this pm for signature.  On 21-Sep-2016 I received the death certificate back from Doctor Titus Mould. I got the death certificate ready and called the funeral home to let them know the death certificate is ready for pickup. I also faxed a copy to the funeral home per the funeral home request.

## 2016-09-04 NOTE — Progress Notes (Addendum)
Critical care medicine consult requested by RN for worsening respiratory status.  Chief complaint: Acute hypoxic and hypercapnic respiratory failure  Interval history: Patient seen this new consult earlier in the day. She was admitted 4 days ago after presenting's of breath from home. History taken from daughter and husband were both bedside as well as medical record. Her course has been complicated by acute on chronic renal failure, atrial flutter with rapid ventricular rate requiring diltiazem infusion. Hypoxia and hypercarbia being treated with noninvasive ventilation. Agitated delirium requiring dexmedetomidine drip. In concern for community-acquired pneumonia. The time of my examination she only responds to noxious stimuli follows some commands but is not conversant. She is unable to offer any complaints  Physical exam Temperature 97.2 heart rate is irregularly irregular showing atrial flutter on the monitor varies between the 70s in the 1 teens. Her respiratory rate is 1622 on BiPAP. Initially she was saturating 98% on 100% FiO2. After titration is down to 91% on 50% FiO2. Her blood pressure is been quite labile. Her maps been in the 60s and 50s over the last 90 minutes. She is an obese elderly female who appears pale and chronically ill. She has shallow respirations clear apical lungs without wheezing diminished mid to lower lung zones. Heart sounds S1-S2 no murmur she has 3+ pitting edema bilateral lower extremities above the knees. Her abdomen is protuberant soft nontender nondistended. Musculoskeletal shows no inflamed joints or deformities. Her skin is pale cool and dry  Review of her lab work ABGs show respiratory acidosis that has improved throughout the day as well as a hypoxia. Chemistries since admission show hyponatremia some hyperkalemia low chloride worsening creatinine 3.17 from baseline 1.783 months ago. She has a proBNP of 300 nightly on admission trending down her point-of-care  troponin was negative her lactic acid has been less than 2 on 2 occasions she has an osmolality of 313 which is likely due to her uremia or percussive tone is 05 which is concerning for infectious source although not "through the roof "CBC shows a downtrending white count tenderness able normocytic anemia and normal platelets her PTT is 113 on a heparin drip blood sugars are mildly uncontrolled urinalysis not consistent with UTI MRSA screen negative blood cultures no growth to date chest x-ray appears to show bilateral diffuse interstitial infiltrates more significant at the bases most likely represents pulmonary edema on atelectasis although pneumonia is possible. EKG shows atrial flutter echocardiogram shows LVH EF 46-27 and diastolic dysfunction and evidence of right heart dysfunction or fluid overload based on IVC that is not collapsing.  Peritnent Recent Medications apixaban stopped Azithromycin 25 Bisoprolol stopped Ceftriaxone day 5 Precedex infusion Diltiazem infusion is currently off Lasix has been held Amaryl 1 dose of Haldol Heparin drip Insulin Duo nebs Levothyroxin  Input/output Since admission +2.2 L  Cardiology and nephrology have consulted. Cardiology opinions that her primary issue is of pulmonary nature and nephrology opinions that renal replacement is not indicated at present but is likely to be needed in the future.  BiPAP switch to a VATS mode M6 100 rate 16-20 pressure 12-24 respiratory pressure. 50% FiO2  Assessment Is possible that COPD and community-acquired pneumonia is contributing to her hypoxia and hypercapnia however given that her white count is trending down, she is not having fevers or hypothermia she is having a productive cough or any cough and she is not developing any type of dense lobar infiltrate think the likelihood that pneumonia is her primary pulmonary issue is low. Additionally  she does not exhibit wheezing on exam consistent with severe  exacerbation of COPD. Her clinical exam is one significant fluid overload. She has very significant lower extremity edema above the knee. Her chest x-ray exhibits bilateral pulmonary vascular congestion and likely effusions. Her echocardiogram is consistent with abnormal relaxation.  Given her recent struggle with rapid ventricular rate and poor a total contractile atrial flutter does not seem unreasonable to believe that this contributed to poor forward flow pulmonary edema. Additionally her IVC without respiratory change suggest that her right-sided heart is pressure and likely also volume overloaded. The most likely driving factor is her acute renal failure in positive fluid balance. I suspect she has a component of uremic encephalopathy given her agitated delirium and overall lethargy. I had a prolonged discussion bedside with her husband, her daughter, and a family friend. I explained that she exhibits respiratory insufficiency and I recommended invasive mechanical ventilation for life support. After discussions of the risks benefits and alternatives of mechanical ventilation specifically the fact that she has hypoxia arrhythmia and a low blood pressure which would increase the risks of intubation both her husband and her daughter feel that they would prefer to continue with trial noninvasive ventilation. I did explain to them that prolonging intubation may lead to a more precarious situation should emergent need for intubation. They are aware of this risk.  Due to map of 53 low-dose peripheral norepinephrine was started. The nurse and I both inspected the IV air cells to spend a use for a number of hours appears patent with no signs of infiltration or swelling. The site will be monitored by the nurse as long as the confusion persists.  Plan Continue with noninvasive mechanical ventilation, goal saturation 88-92%  Repeat ABG Peripheral norepinephrine map goal greater than 65 (I have ordered  phenylephrine as her heart rates dipping in the 70s and I did not want to slow her heart rate anymore given she is on Precedex) Hold heparin drip next in anticipation of need for either a central line or a dialysis catheter Nephrology follow-up in the morning for evaluation for renal replacement therapy  Problems Acute hypoxic and hypercapnic respiratory failure Acute kidney injury Shock - multifactorial due to arrhythmia,distributive, fluid overload Atrial flutter Acute on Chronic diastolic dysfunction Suspected community-acquired pneumonia  Upon my evaluation, this patient had a high probability of imminent or life-threatening deterioration due to acute hypoxemic and hypercapnic respiratory failure  high complexity decision making to assess, manipulate, and support vital organ system failure including discussion of CODE STATUS, manipulation of noninvasive ventilation  I have personally provided 80 minutes of critical care time exclusive of time spent on separately billable procedures and education. Time includes review and summation of previous medical record, laboratory data, radiology results, independent review of CXR and ekg, coordination of care with RN, and monitoring for potential decompensation of respiratory status. Interventions were performed as documented above.  Condition: Critical Prognosis: Poor Code Status: Full  Charlesetta Garibaldi, MD Critical Care Medicine

## 2016-09-04 NOTE — Progress Notes (Signed)
7/27  CDS called for referral:  # [phone]. Call CDS with time of death.

## 2016-09-04 NOTE — Progress Notes (Signed)
This note also relates to the following rows which could not be included: Pulse Rate - Cannot attach notes to unvalidated device data Resp - Cannot attach notes to unvalidated device data SpO2 - Cannot attach notes to unvalidated device data  Pt was placed on AVAPS by MD.  Vt set 600 with MAX 24  And 50%.     09/19/16 0025  BiPAP/CPAP/SIPAP  BiPAP/CPAP/SIPAP Pt Type Adult  Mask Type Full face mask  Mask Size Medium  Set Rate 16 breaths/min  Respiratory Rate 16 breaths/min  EPAP 10 cmH2O  Oxygen Percent 50 %  Minute Ventilation 9.7  Leak 48  Peak Inspiratory Pressure (PIP) 14  Tidal Volume (Vt) 560  BiPAP/CPAP/SIPAP (AVAPS)  Patient Home Equipment No  Auto Titrate No  Press High Alarm 25 cmH2O  Press Low Alarm 5 cmH2O  BiPAP/CPAP /SiPAP Vitals  Bilateral Breath Sounds Diminished

## 2016-09-04 NOTE — Progress Notes (Signed)
Chaplain visited spouse of patient to offer presence and support.  Spouse said that he didn't think things would go this way but is trying to make peace as he carries out her wishes.  Chaplain will remain available for this family as needed.  Please page should you feel additional support would be helpful.    28-Sep-2016 1446  Clinical Encounter Type  Visited With Patient and family together  Visit Type Follow-up;Spiritual support

## 2016-09-04 NOTE — Progress Notes (Signed)
Admit: 08/10/2016 LOS: 44  83 yo F with CKD III-IV at baseline who presented with worsening SOB. Admitted with concern for LLL PNA, acute systolic CHF, hypoxic respiratory failure, atrial fibrillation with RVR, and AKI. She was started antibiotics, IV lasix, and spironolactone. Cardiology was consulted for rapid atrial flutter and cardizem was initiated. Unfortunately UOP declined and renal function has failed to improve. Nephrology consulted for further management.   Subjective:  Persistent hypotension, on neo via PIV. On BiPAP, now in ICU.    07/26 0701 - 07/27 0700 In: 1337.9 [I.V.:437.9; IV Piggyback:900] Out: 0   Filed Weights   08/28/16 0500 08/29/16 0320 2016-09-02 0500  Weight: 218 lb 7.6 oz (99.1 kg) 217 lb 6 oz (98.6 kg) 221 lb 12.5 oz (100.6 kg)    Scheduled Meds: . etomidate  20 mg Intravenous On Call  . fentaNYL (SUBLIMAZE) injection  50 mcg Intravenous On Call  . insulin aspart  2-6 Units Subcutaneous Q4H  . ipratropium  0.5 mg Nebulization Q6H  . levalbuterol  0.63 mg Nebulization Q6H  . levothyroxine  25 mcg Intravenous Daily  . mouth rinse  15 mL Mouth Rinse BID  . methylPREDNISolone (SOLU-MEDROL) injection  40 mg Intravenous Q12H  . metoprolol tartrate  2.5 mg Intravenous Q8H  . rocuronium  50 mg Intravenous On Call  . sodium chloride flush  3 mL Intravenous Q12H   Continuous Infusions: . sodium chloride    . azithromycin Stopped (08/29/16 2200)  . cefTRIAXone (ROCEPHIN)  IV Stopped (08/29/16 1732)  . dexmedetomidine (PRECEDEX) IV infusion 0.4 mcg/kg/hr (2016/09/02 0500)  . diltiazem (CARDIZEM) infusion Stopped (08/29/16 2140)  . fentaNYL infusion INTRAVENOUS    . norepinephrine (LEVOPHED) Adult infusion 5 mcg/min (09-02-16 0500)   PRN Meds:.sodium chloride, acetaminophen, magic mouthwash w/lidocaine, ondansetron (ZOFRAN) IV, sodium chloride flush  Current Labs: reviewed    Physical Exam:  Blood pressure 94/71, pulse (!) 125, temperature (!) 97 F (36.1 C),  temperature source Axillary, resp. rate 18, height 5\' 5"  (1.651 m), weight 221 lb 12.5 oz (100.6 kg), SpO2 93 %. Gen: On BiPAP, altered  Heart: RRR Lungs: Course breath sounds Ext: Bilateral lower extremity edema  Skin: no rash   A 1. Acute on Chronic Renal Failure: Baseline CKD III - IV (Scr ~2.0) now with oliguria. Renal function continues to decline. Likely ATN in the setting of ongoing hypotension and afib w/ RVR. 2. Hyperkalemia: Worsening 3. Hyponatremia: In the setting of acute renal failure/reduced GFR and acute dCHF. Likely hypotonic, hypervolemic. Now on fluid restriction.  4. Acute on Chronic Respiratory Failure: Multifactorial due to CAP, acute dCHF exacerbation, and underlying COPD. On BiPAP.  5. Paroxsymal a-fib/a-flutter: Card followings  P 1. Overall poor prognosis. CRRT offered to patient and family today, however, after a long goals of care discussion with daughter and ?husband/signficant other (acting jointly as Media planner), both agree dialysis would be against the patient's wishes. Dr. Justin Mend will readdress this afternoon and confirm with family. Unsure if blood pressure would tolerate high dose diuretics, will defer this to PCCM. Consider comfort care pending family's wishes.  2. Will follow up AM labs. Strict I/Os, daily weights, avoid nephrotoxic agents.  3. Holding lasix and spironolactone given hypotension  4. Fluid restrict, 1.5L  5. Other issues per primary and cards.   Velna Ochs, M.D. - PGY2 Pager: [phone] 02-Sep-2016, 7:35 AM    Recent Labs Lab 08/28/16 0513 08/29/16 0300 2016/09/02 0603  NA 130* 126* 125*  K 4.8 5.1 5.7*  CL  92* 90* 90*  CO2 26 24 18*  GLUCOSE 119* 192* 123*  BUN 73* 89* 103*  CREATININE 3.34* 3.72* 4.15*  CALCIUM 8.9 8.7* 8.3*  PHOS  --   --  7.6*    Recent Labs Lab 08/27/16 0231 08/28/16 0513 09-29-2016 0603  WBC 12.9* 12.8* 12.0*  HGB 11.0* 10.5* 11.5*  HCT 35.0* 34.4* 35.8*  MCV 89.1 88.7 88.2  PLT 342 383 346

## 2016-09-04 NOTE — Progress Notes (Signed)
Progress Note  Patient Name: Kristy Lish Purifoy Date of Encounter: 09-18-16  Primary Cardiologist: Dr. Meda Coffee  Subjective   On BiPAP appears to be resting comfortably  Inpatient Medications    Scheduled Meds: . etomidate  20 mg Intravenous On Call  . fentaNYL (SUBLIMAZE) injection  50 mcg Intravenous On Call  . insulin aspart  2-6 Units Subcutaneous Q4H  . ipratropium  0.5 mg Nebulization Q6H  . levalbuterol  0.63 mg Nebulization Q6H  . levothyroxine  25 mcg Intravenous Daily  . mouth rinse  15 mL Mouth Rinse BID  . methylPREDNISolone (SOLU-MEDROL) injection  40 mg Intravenous Q12H  . metoprolol tartrate  2.5 mg Intravenous Q8H  . rocuronium  50 mg Intravenous On Call  . sodium chloride flush  3 mL Intravenous Q12H   Continuous Infusions: . sodium chloride    . azithromycin Stopped (08/29/16 2200)  . cefTRIAXone (ROCEPHIN)  IV Stopped (08/29/16 1732)  . dexmedetomidine (PRECEDEX) IV infusion 0.4 mcg/kg/hr (18-Sep-2016 0500)  . diltiazem (CARDIZEM) infusion Stopped (08/29/16 2140)  . fentaNYL infusion INTRAVENOUS    . norepinephrine (LEVOPHED) Adult infusion 5 mcg/min (Sep 18, 2016 0500)   PRN Meds: sodium chloride, acetaminophen, magic mouthwash w/lidocaine, ondansetron (ZOFRAN) IV, sodium chloride flush   Vital Signs    Vitals:   09-18-16 0630 2016/09/18 0645 Sep 18, 2016 0700 September 18, 2016 0752  BP: 92/73 (!) 89/68 94/71   Pulse: (!) 125 (!) 125 (!) 125   Resp: 20 17 18    Temp:    (!) 97.3 F (36.3 C)  TempSrc:    Axillary  SpO2: 93% 93% 93%   Weight:      Height:        Intake/Output Summary (Last 24 hours) at 2016-09-18 0813 Last data filed at Sep 18, 2016 0700  Gross per 24 hour  Intake          1337.91 ml  Output                0 ml  Net          1337.91 ml    I/O since admission: +2749  Filed Weights   08/28/16 0500 08/29/16 0320 Sep 18, 2016 0500  Weight: 218 lb 7.6 oz (99.1 kg) 217 lb 6 oz (98.6 kg) 221 lb 12.5 oz (100.6 kg)    Telemetry    Atrial Flutter at  126- Personally Reviewed  ECG    08/28/16 ECG (independently read by me): A flutter with variable block  Will repeat today   Physical Exam   BP 94/71 (BP Location: Right Arm)   Pulse (!) 125   Temp (!) 97.3 F (36.3 C) (Axillary)   Resp 18   Ht 5\' 5"  (1.651 m)   Wt 221 lb 12.5 oz (100.6 kg)   SpO2 93%   BMI 36.91 kg/m  General: sedated Skin: normal turgor, no rashes, warm and dry HEENT: Normocephalic, atraumatic. Pupils equal round and reactive to light; sclera anicteric; extraocular muscles intact;  Nose without nasal septal hypertrophy Mouth/Parynx benign;  Neck: No JVD, no carotid bruits; normal carotid upstroke Lungs: clear to ausculatation and percussion; no wheezing or rales Chest wall: without tenderness to palpitation Heart: tachycardic at 126;  s1 s2 normal, 1/6 systolic murmur, no diastolic murmur, no rubs, gallops, thrills, or heaves Abdomen: soft, nontender; no hepatosplenomehaly, BS+; abdominal aorta nontender and not dilated by palpation. Back: no CVA tenderness Pulses 2+ Musculoskeletal: full range of motion, normal strength, no joint deformities Extremities: trivial edema, Homan's sign negative  Neurologic: grossly nonfocal;  Cranial nerves grossly wnl   Labs    Chemistry Recent Labs Lab 08/28/16 0513 08/29/16 0300 09/12/2016 0603  NA 130* 126* 125*  K 4.8 5.1 5.7*  CL 92* 90* 90*  CO2 26 24 18*  GLUCOSE 119* 192* 123*  BUN 73* 89* 103*  CREATININE 3.34* 3.72* 4.15*  CALCIUM 8.9 8.7* 8.3*  GFRNONAA 12* 10* 9*  GFRAA 14* 12* 11*  ANIONGAP 12 12 17*     Hematology Recent Labs Lab 08/27/16 0231 08/28/16 0513 09-12-2016 0603  WBC 12.9* 12.8* 12.0*  RBC 3.93 3.88 4.06  HGB 11.0* 10.5* 11.5*  HCT 35.0* 34.4* 35.8*  MCV 89.1 88.7 88.2  MCH 28.0 27.1 28.3  MCHC 31.4 30.5 32.1  RDW 13.3 13.5 13.9  PLT 342 383 346    Cardiac EnzymesNo results for input(s): TROPONINI in the last 168 hours.  Recent Labs Lab 08/14/2016 1448  TROPIPOC 0.04       BNP Recent Labs Lab 08/26/2016 1435 08/27/16 0231  BNP 389.5* 174.8*     DDimer No results for input(s): DDIMER in the last 168 hours.   Lipid Panel  No results found for: CHOL, TRIG, HDL, CHOLHDL, VLDL, LDLCALC, LDLDIRECT    Radiology    Dg Chest Port 1 View  Result Date: 09/12/2016 CLINICAL DATA:  Respiratory failure, shortness of Breath EXAM: PORTABLE CHEST 1 VIEW COMPARISON:  08/29/2016 FINDINGS: Cardiomegaly with bilateral perihilar and lower lobe opacities. Layering effusions. No change since prior study. No acute bony abnormality. IMPRESSION: No significant change in bilateral layering effusions and bilateral lower lobe opacities. Electronically Signed   By: Rolm Baptise M.D.   On: 12-Sep-2016 07:45   Dg Chest Port 1 View  Result Date: 08/29/2016 CLINICAL DATA:  Hypoxia. History of chronic respiratory failure, COPD, tobacco abuse, CHF, and diabetes. EXAM: PORTABLE CHEST 1 VIEW COMPARISON:  Chest x-ray of August 28, 2016 FINDINGS: The lungs are adequately inflated. Increased density in the mid and lower lungs likely reflects posterior layering pleural effusions. The hemidiaphragms are largely obscured greatest on the left. The retrocardiac region is dense. Density in the left suprahilar region is less conspicuous today. The cardiac silhouette is enlarged. The pulmonary vascularity is not clearly engorged. There is calcification in the wall of the aortic arch. IMPRESSION: Decreased left suprahilar atelectasis or infiltrate. Persistent cardiomegaly with central pulmonary vascular prominence and bilateral pleural effusions. Probable bibasilar atelectasis or pneumonia. Thoracic aortic atherosclerosis. Electronically Signed   By: David  Martinique M.D.   On: 08/29/2016 12:12   Dg Chest Port 1 View  Result Date: 08/28/2016 CLINICAL DATA:  Shortness of breath and cough today. EXAM: PORTABLE CHEST 1 VIEW COMPARISON:  09/02/2016 FINDINGS: Increased densities in the left upper chest and left  suprahilar region. Increased densities at both lung bases are suggestive for consolidation and pleural fluid. Again noted is cardiomegaly. Atherosclerotic calcifications at the aortic arch. Negative for pneumothorax. Increased densities in the right apex region. IMPRESSION: Increased basilar chest densities and markedly increased densities in the left upper lung/left suprahilar region. The left upper chest disease may represent atelectasis but infection cannot be excluded. Increased basilar chest densities are suggestive for pleural effusions with consolidation. Cardiomegaly. Electronically Signed   By: Markus Daft M.D.   On: 08/28/2016 09:20    Cardiac Studies    Echo 08/26/16- Study Conclusions  - Left ventricle: The cavity size was normal. Wall thickness was increased in a pattern of mild LVH. Systolic function was normal. The estimated ejection fraction was in  the range of 60% to 65%. Incoordinate septal motion. Doppler parameters are consistent with abnormal left ventricular relaxation (grade 1 diastolic dysfunction). The E/e&' ratio is between 8-15, suggesting indeterminate LV filling pressure. - Mitral valve: Mildly thickened leaflets . There was trivial regurgitation. - Left atrium: The atrium was normal in size. - Inferior vena cava: The vessel was dilated. The respirophasic diameter changes were blunted (<50%), consistent with elevated central venous pressure.  Impressions:  - LVEF 60-65%, mild LVH, incoordinate septal motion, grade 1 DD, indeterminate LV filling pressure, trivial MR, normal LA size, dilated IVC. Compared to a prior echo in 05/2016, the LVEF has improved.  Patient Profile       83 y.o. female admitted with acute on chronic respiratory failure 08/20/2016, found to be in recurrent atrial flutter  Assessment & Plan    1. Acute on chronic respiratory failure: now on bi-pap; O2 sat 93% CXR today without significant change in bilateral  layering effusions and bilateral lower lobe opacities.  2. Atrial Flutter; Rate increased, but BP is low at 91/70 limiting initiation of iv cardizem or beta blocker treatment.  eliquis dc; was transitioned to heparin, now on hold with potential for central line placement. If unable to slow down ? Amiodarone bolus and infusion,  but would be hesitant for extended use with COPD/lung disease.  3. Acute on chronic diastolic heart failure;  EF 60 - 65%;  Exacerbated by AF with increased rate.  4. AKI; Stage 5 Cr now increased to 4.15 / ?  need for impending dialysis; nephrology is following  Signed, Troy Sine, MD, [facility] Sep 26, 2016, 8:13 AM

## 2016-09-04 NOTE — Progress Notes (Signed)
Patient was taken off of bipap and placed on 2L nasal cannula per request of family for "withdrawal of care" orders.  Patient tolerating well and asking for water.  Will continue to monitor.

## 2016-09-04 NOTE — Progress Notes (Signed)
Chaplain responded to consult for support for family that has made a decision to begin comfort measures for their loved one.  Husband and daughter at bedside and are appreciative of all of the care they have received.  They have praised the doctors and medical staff.  Chaplain has provided prayer for the patient and family.  Chaplain will remain available for this patient and family.   Husband and patient has been together almost 20 years.  Husband and daughter are both teary at the unexpected outcome but are expressing that they do not want to be selfish but not put her through any additional suffering.    Please page or place consult for Chaplain as needed.    09/07/2016 1118  Clinical Encounter Type  Visited With Patient and family together  Visit Type Initial;Spiritual support;Social support;Critical Care  Spiritual Encounters  Spiritual Needs Prayer

## 2016-09-04 NOTE — Progress Notes (Addendum)
PULMONARY / CRITICAL CARE MEDICINE   Name: Kristy Mcdonald MRN: [id number] DOB: [birth year]    ADMISSION DATE:  08/04/2016 CONSULTATION DATE:  08/29/2016  REFERRING MD:  Dr. Lonny Prude  CHIEF COMPLAINT:  AMS  HISTORY OF PRESENT ILLNESS:   83 year old female with PMH of HFpEF (EF 50% in 05/2016), Chronic Edema, CKD stage 4 (baseline Cr ~1.8-2.2), COPD, DM, HTN, Hypokalemia, PAF on eliquis, Chronic Hypoxic Respiratory Failure on 2L [place] at home, and Tobacco Abuse, Recent treatment of COPD exacerbation one month ago with steroids and z-pac  Presents 7/22 with reported several days of dyspnea (in which worsened with exertion) and leg swelling/weight gain. CXR with left lower lobe PNA, WBC 15, BNP elevated. Admitted to family medicine service. Throughout stay has become increasingly confused and hypoxic requiring increased oxygen requirements and sedative medications. This morning patient was on 15L [place], alert, however stating she could not breath. Was given PRN dose of Haldol and an ABG was obtained. ABG of 7.180/61.3/52.9. PCCM asked to consult.  SUBJECTIVE:  On BiPAP, sats >92% Weaning levophed On precedex  VITAL SIGNS: BP 93/74   Pulse (!) 126   Temp (!) 97.3 F (36.3 C) (Axillary)   Resp 20   Ht 5\' 5"  (1.651 m)   Wt 221 lb 12.5 oz (100.6 kg)   SpO2 92%   BMI 36.91 kg/m   HEMODYNAMICS:    VENTILATOR SETTINGS: FiO2 (%):  [50 %-100 %] 50 %  INTAKE / OUTPUT: I/O last 3 completed shifts: In: 1340.9 [I.V.:440.9; IV Piggyback:900] Out: 0   PHYSICAL EXAMINATION: General:  Adult female,lying in bed in no acute distress, on BiPAP Neuro: Lethargic, moves extremities to physical stimulation, does not follow commands HEENT: Dry MM, PERRL Cardiovascular:  Irregular, no MRG Lungs:  Diminished breath sounds, non-labored Abdomen:  Obese, active bowel sounds Musculoskeletal:  +1 edema, peripheral pulses intact Skin:  Warm, dry, intact   LABS:  BMET  Recent Labs Lab 08/28/16 0513  08/29/16 0300 Sep 09, 2016 0603  NA 130* 126* 125*  K 4.8 5.1 5.7*  CL 92* 90* 90*  CO2 26 24 18*  BUN 73* 89* 103*  CREATININE 3.34* 3.72* 4.15*  GLUCOSE 119* 192* 123*    Electrolytes  Recent Labs Lab 08/28/16 0513 08/29/16 0300 Sep 09, 2016 0603  CALCIUM 8.9 8.7* 8.3*  MG  --   --  2.5*  PHOS  --   --  7.6*    CBC  Recent Labs Lab 08/27/16 0231 08/28/16 0513 09/09/16 0603  WBC 12.9* 12.8* 12.0*  HGB 11.0* 10.5* 11.5*  HCT 35.0* 34.4* 35.8*  PLT 342 383 346    Coag's  Recent Labs Lab 08/29/16 1933 September 09, 2016 0603  APTT 113* 33    Sepsis Markers  Recent Labs Lab 08/10/2016 1626 08/29/16 1204 September 09, 2016 0603  LATICACIDVEN 1.84 1.4  --   PROCALCITON  --  0.50 0.63    ABG  Recent Labs Lab 08/29/16 1620 08/29/16 1637 2016-09-09 0133  PHART 7.260* 7.214* 7.226*  PCO2ART 53.4* 58.4* 54.9*  PO2ART 37.0* 50.0* 60.3*    Liver Enzymes No results for input(s): AST, ALT, ALKPHOS, BILITOT, ALBUMIN in the last 168 hours.  Cardiac Enzymes No results for input(s): TROPONINI, PROBNP in the last 168 hours.  Glucose  Recent Labs Lab 08/29/16 1019 08/29/16 1226 08/29/16 1957 09-09-16 0110 Sep 09, 2016 0419 Sep 09, 2016 0754  GLUCAP 197* 224* 109* 121* 130* 119*    Imaging Dg Chest Port 1 View  Result Date: 2016/09/09 CLINICAL DATA:  Respiratory failure, shortness  of Breath EXAM: PORTABLE CHEST 1 VIEW COMPARISON:  08/29/2016 FINDINGS: Cardiomegaly with bilateral perihilar and lower lobe opacities. Layering effusions. No change since prior study. No acute bony abnormality. IMPRESSION: No significant change in bilateral layering effusions and bilateral lower lobe opacities. Electronically Signed   By: Rolm Baptise M.D.   On: 2016-09-11 07:45   Dg Chest Port 1 View  Result Date: 08/29/2016 CLINICAL DATA:  Hypoxia. History of chronic respiratory failure, COPD, tobacco abuse, CHF, and diabetes. EXAM: PORTABLE CHEST 1 VIEW COMPARISON:  Chest x-ray of August 28, 2016  FINDINGS: The lungs are adequately inflated. Increased density in the mid and lower lungs likely reflects posterior layering pleural effusions. The hemidiaphragms are largely obscured greatest on the left. The retrocardiac region is dense. Density in the left suprahilar region is less conspicuous today. The cardiac silhouette is enlarged. The pulmonary vascularity is not clearly engorged. There is calcification in the wall of the aortic arch. IMPRESSION: Decreased left suprahilar atelectasis or infiltrate. Persistent cardiomegaly with central pulmonary vascular prominence and bilateral pleural effusions. Probable bibasilar atelectasis or pneumonia. Thoracic aortic atherosclerosis. Electronically Signed   By: David  Martinique M.D.   On: 08/29/2016 12:12     STUDIES:  CXR 7/22 > Enlargement of cardiac silhouette, Atelectasis vs consolidation LLL CXR 7/25 > Increased densities in the left upper chest and left suprahilar region, increased densities at both lung bases are suggestive for consolidation and pleural fluid  CULTURES: Blood 7/22 > Negative  MRSA by PCR 7/22 > Negative  Sputum  >>   ANTIBIOTICS: Azithromycin 7/22 >> Ceftriaxone 7/22 >>   SIGNIFICANT EVENTS: 7/22 > Presents to ED with Dyspnea 7/26 > Transferred to ICU  LINES/TUBES: PIV  DISCUSSION: 83 year old female presents to ED on 7/22 with dyspnea, diagnosised with CAP vs Acute HF. Stay complicated by progressive CKD. Throughout stay has become progressively confused and hypoxic, requiring haldol. ABG of 7.180/61.3/52.9. PCCM asked to transfer to ICU.  ASSESSMENT / PLAN:  PULMONARY A: Acute on Chronic Hypoxic/Hypercarbic Respiratory Failure in setting of CAP +/- AECOPD Possible LLL PNA, on ceftriaxone/azithromycin H/O Tobacco Abuse ABG at 1am: 7.23/55/60 on BiPAP, 50% FiO2 P:   Maintain Oxygen >88 (home 2L) Possible intubation if resp status declines > family currently wants to continue BiPAP Trend ABG/CXR Pulmonary  Hygiene CT Chest ordered previously however on hold due to instability Cont Abx Cont scheduled ipratropium, albuterol & solumedrol  CARDIOVASCULAR A:  Acute on Chronic Systolic/Diastolic HF - echo with LVEF 68-34%, grade 1 diastolic dysfxn, trivial MR, dilated IVC Shock, likely multifactorial, cardiogenic/distributive A.Fib with RVR Positive 1.3L On levophed P:  Cardiac Monitoring Cardiology following Maintain MAP >65 Hold Xarelto > Heparin Gtt on hold since last night for possible central line/dialysis catheter Continue Scheduled Metoprolol 2.5 q8h per cardiology Cardizem gtt currently off due to HR being WNL  RENAL A:   Acute on Chronic Kidney Disease Stage IV secondary to ATN secondary to hypotension? Hyponatremia  Hyperkalemia s/p kayexalate  P:   Trend BMP Nephrology Following, family wishes not to start dialysis Avoid Nephrotoxic Medications Continue to hold Lasix and Aldactone  Continue Fluid Restriction of 1.5L   GASTROINTESTINAL A:   No issues  P:   NPO PPI  HEMATOLOGIC A:   Anticoagulation needs for A.Fib P:  Trend CBC Heparin gtt SCDS  INFECTIOUS A:   CAP +/-AECOPD  PCT 0.63 Leukocytosis P:   Trend WBC and Fever Curve Trend PCT and Lactic Acid Follow Culture Data Continue azithromycin/ceftriaxone  ENDOCRINE  A:   DM 2    Hypothyroidism   P:   CBG & SSI Continue Synthroid  NEUROLOGIC A:   Acute Metabolic vs Polypharmacy vs [place] Encephalopathy  -Has had reported confusion, given haldol 7/26 H/O Depression, Diabetic Nephropathy P: Monitor Hold Sedative Medications   Family discussion: ?comfort care  Colbert Ewing, MD Internal Medicine, PGY-1  STAFF NOTE: I, Merrie Roof, MD FACP have personally reviewed patient's available data, including medical history, events of note, physical examination and test results as part of my evaluation. I have discussed with resident/NP and other care providers such as pharmacist, RN and  RRT. In addition, I personally evaluated patient and elicited key findings of: NIMV, distress, ronchi crackles, abdo obese, edema present 1 plus, pcxr which I reviewed showed effusion, worsening edema rt  Greater left, she is failing NIMV, has Home o2 and ARF,in setting osa, copd, her prognosis is poor, she requires interuption NIMv and does not tolerate therefore needs ett and cvvhd if we were to be aggressive, will discuss role comfort care now with fmaily, if we r not to do cvhd, then consider higher dose lasix and metolazone, maintain npo, abx mainatin, follow pcxr rt side changes in am , we need to control MAP BP more affective, consider metoprolol IV, may need esmolol drip, see addition update on faily decisions The patient is critically ill with multiple organ systems failure and requires high complexity decision making for assessment and support, frequent evaluation and titration of therapies, application of advanced monitoring technologies and extensive interpretation of multiple databases.   Critical Care Time devoted to patient care services described in this note is 35 Minutes. This time reflects time of care of this signee: Merrie Roof, MD FACP. This critical care time does not reflect procedure time, or teaching time or supervisory time of PA/NP/Med student/Med Resident etc but could involve care discussion time. Rest per NP/medical resident whose note is outlined above and that I agree with   Lavon Paganini. Titus Mould, MD, FACP Pgr: [facility] 2016/09/16 10:03 AM   I have had extensive discussions with family husband. We discussed patients current circumstances and organ failures. We also discussed patient's prior wishes under circumstances such as this. Family has decided to NOT perform resuscitation i   Extensive discussion with family husband, duaghter. We discussed the poor prognosis and likely poor quality of life. Family has decided to offer full  comfort care. They are aware that the patient may be transferred to palliative care floor for continued comfort care needs. They have been fully updated on the process and expectations.

## 2016-09-04 DEATH — deceased

## 2016-09-13 ENCOUNTER — Ambulatory Visit: Payer: Medicare Other | Admitting: Cardiology

## 2016-09-16 NOTE — Discharge Summary (Signed)
Death Note   83 year old female with PMH of HFpEF (EF 50% in 05/2016), Chronic Edema, CKD stage 4 (baseline Cr ~1.8-2.2), COPD, DM, HTN, Hypokalemia, PAF on eliquis, Chronic Hypoxic Respiratory Failure on 2L [place] at home, and Tobacco Abuse, Recent treatment of COPD exacerbation one month ago with steroids and z-pac  Presents 7/22 with reported several days of dyspnea (in which worsened with exertion) and leg swelling/weight gain. CXR with left lower lobe PNA, WBC 15, BNP elevated. Admitted to family medicine service. Throughout stay has become increasingly confused and hypoxic requiring increased oxygen requirements and sedative medications. This morning patient was on 15L [place], alert, however stating she could not breath. Was given PRN dose of Haldol and an ABG was obtained. ABG of 7.180/61.3/52.9. PCCM asked to consult  Bipap used, precedex required, was in shock on levophed pcxr with LLL PNA Had acute on chronic renal failure Family did nont want any forms HD, was overloaded and then opted for comfort care and expired  Final diagnosis upon death  1. Acute on chronic renal failure 2. Pulm edema 3. Left Lower LObe PNeumonia 4. Comfort care 5. Encephalaopthy 5. Afib  Lavon Paganini. Titus Mould, MD, [place] Pgr: [facility]

## 2017-09-27 IMAGING — DX DG CHEST 1V PORT
1 series · 1 of 1 positions shown · non-contrast
Comparison: Chest radiograph April 25, 2016

CLINICAL DATA: Tachycardia, feeling unwell. In atrial fibrillation.
Recent falls.

EXAM:
PORTABLE CHEST 1 VIEW

[chest ap]
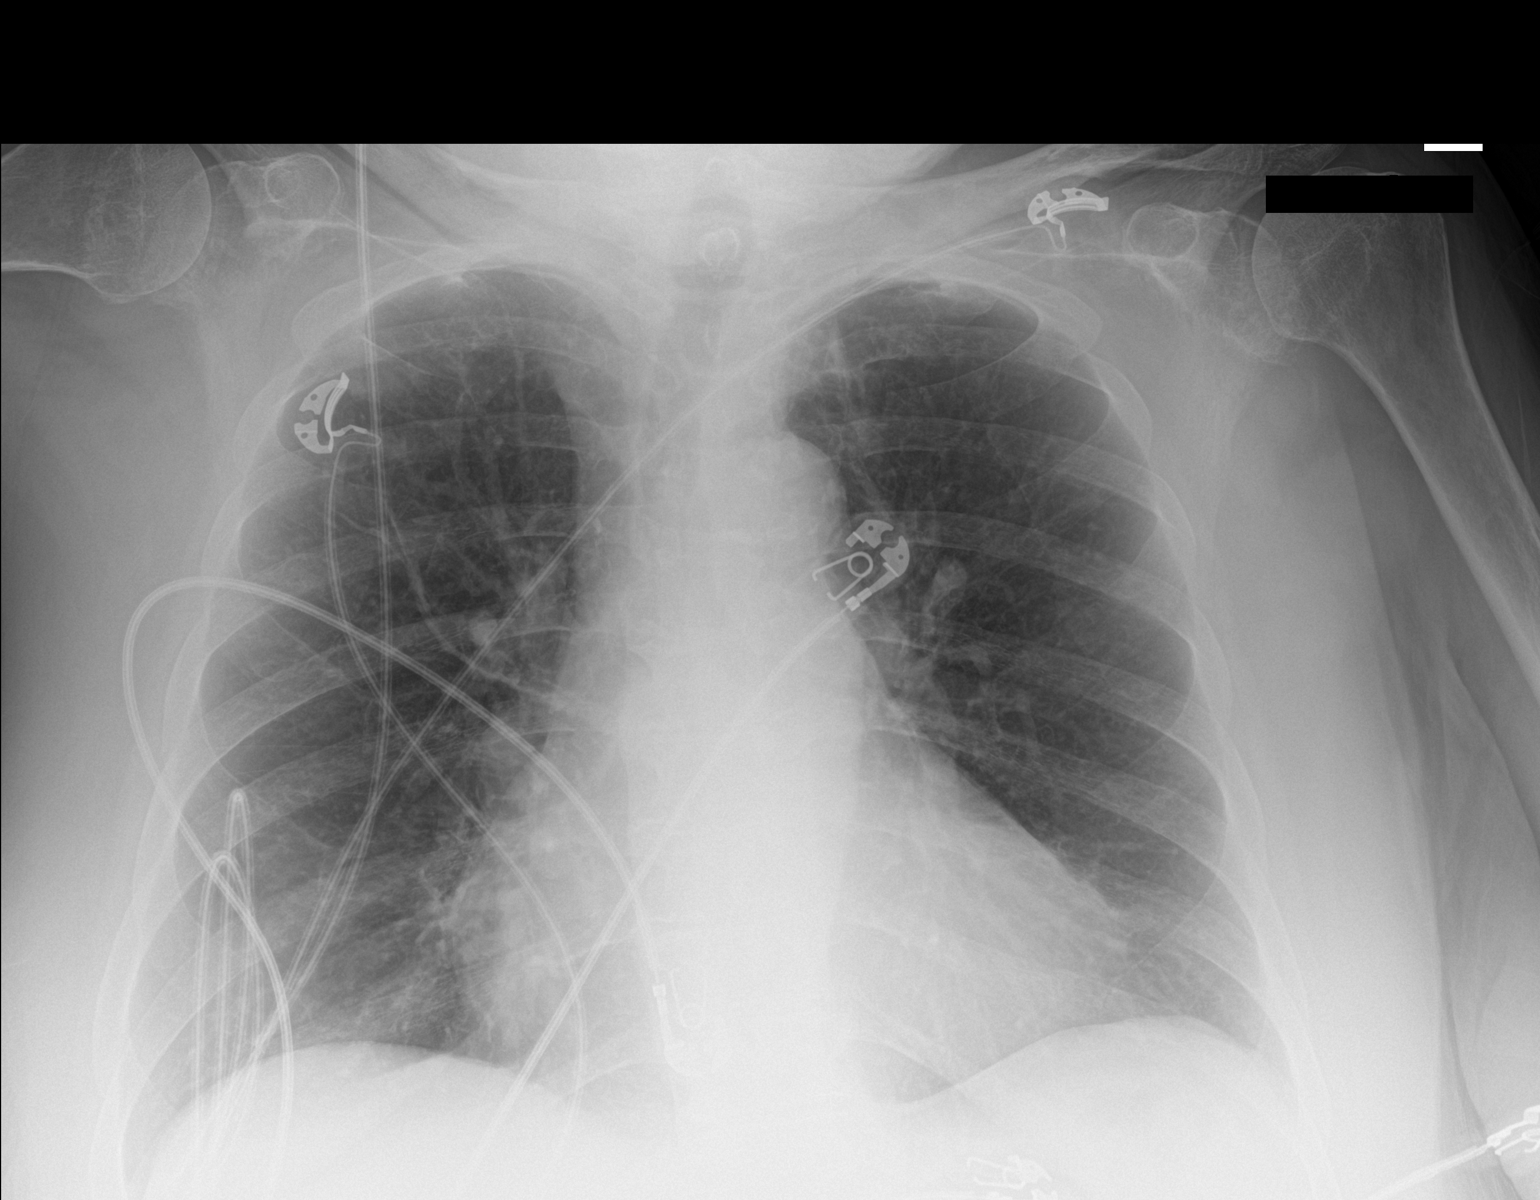

[1 of 1 positions shown; findings below may reference images not displayed]

FINDINGS: Cardiac silhouette is mildly enlarged. Mediastinal silhouette is
nonsuspicious. Mildly calcified aortic knob. No pleural effusion or
focal consolidation, improved aeration from prior imaging. No
pneumothorax. Osteopenia. Soft tissue planes are nonsuspicious.
IMPRESSION: Mild cardiomegaly.  No acute pulmonary process.

## 2017-09-28 IMAGING — NM NM PULMONARY VENT & PERF
15 series · 15 of 15 positions shown · non-contrast
Comparison: Chest x-ray from the same day.

CLINICAL DATA: Dyspnea.  Ongoing cough.  COPD.

EXAM:
NUCLEAR MEDICINE VENTILATION - PERFUSION LUNG SCAN
TECHNIQUE: Ventilation images were obtained in multiple projections using
inhaled aerosol Bc-OOm DTPA. Perfusion images were obtained in
multiple projections after intravenous injection of Bc-OOm MAA.
RADIOPHARMACEUTICALS:  Thirty-two mCi 1echnetium-XXm DTPA aerosol
inhalation and 4.3 mCi 1echnetium-XXm MAA IV

[Series 1: ant/post vent · 4.14mm/px · 1 of 1 slices shown (1 of 2)]
[im 1/1]
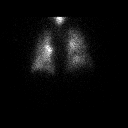

[Series 1: ant/post vent · 4.14mm/px · 1 of 1 slices shown (2 of 2)]
[im 1/1]
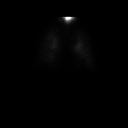

[Series 2: lao/rpo vent · 4.14mm/px · 1 of 1 slices shown (1 of 2)]
[im 1/1]
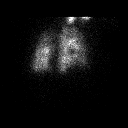

[Series 2: lao/rpo vent · 4.14mm/px · 1 of 1 slices shown (2 of 2)]
[im 1/1]
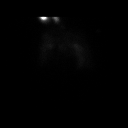

[Series 3: lpo/rao vent · 4.14mm/px · 1 of 1 slices shown (1 of 2)]
[im 1/1]
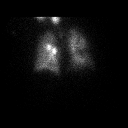

[Series 3: lpo/rao vent · 4.14mm/px · 1 of 1 slices shown (2 of 2)]
[im 1/1]
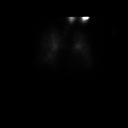

[Series 4: lt lat/rt lat vent · 4.14mm/px · 1 of 1 slices shown (1 of 2)]
[im 1/1]
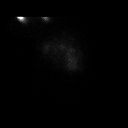

[Series 4: lt lat/rt lat vent · 4.14mm/px · 1 of 1 slices shown (2 of 2)]
[im 1/1]
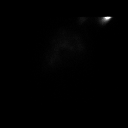

[Series 5: lt lat/rt lat perf · 4.14mm/px · 1 of 1 slices shown (1 of 2)]
[im 1/1]
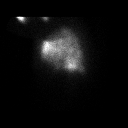

[Series 5: lt lat/rt lat perf · 4.14mm/px · 1 of 1 slices shown (2 of 2)]
[im 1/1]
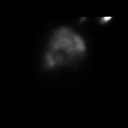

[Series 6: lpo/rao perf · 4.14mm/px · 1 of 1 slices shown (1 of 2)]
[im 1/1]
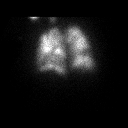

[Series 6: lpo/rao perf · 4.14mm/px · 1 of 1 slices shown (2 of 2)]
[im 1/1]
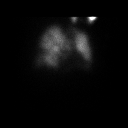

[Series 7: ant/post perf · 4.14mm/px · 1 of 1 slices shown]
[im 1/1]
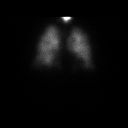

[Series 8: lao/rpo perf · 4.14mm/px · 1 of 1 slices shown (1 of 2)]
[im 1/1]
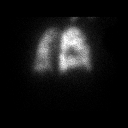

[Series 8: lao/rpo perf · 4.14mm/px · 1 of 1 slices shown (2 of 2)]
[im 1/1]
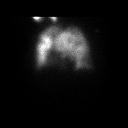

[15 of 15 positions shown; findings below may reference images not displayed]

FINDINGS: Ventilation: A subsegmental defect is present over the
posterolateral right lower lobe. No other focal defects are present.

Perfusion: There is a matched defect in the posterolateral right
lower lobe. This may reflect an overlying defect. No discrete mass
lesion or airspace disease is evident on the chest x-ray. No other
focal filling defects are present.
IMPRESSION: 1. Matched subsegmental ventilation and perfusion defect with a
normal chest radiograph suggesting low probability for pulmonary
embolus.
